# Patient Record
Sex: Female | Born: 1985 | Race: Black or African American | Hispanic: No | Smoking: Former smoker
Health system: Southern US, Community
[De-identification: ages and names within clinical notes are randomized; demographics above are authoritative.]

## PROBLEM LIST (undated history)

## (undated) DIAGNOSIS — I10 Essential (primary) hypertension: Secondary | ICD-10-CM

---

## 2007-03-06 ENCOUNTER — Emergency Department: Payer: Self-pay | Admitting: Emergency Medicine

## 2007-08-04 IMAGING — CR DG CHEST 2V
1 series · 2 of 2 positions shown · non-contrast
Comparison: none

REASON FOR EXAM: cough
COMMENTS:

PROCEDURE:     DXR - DXR CHEST PA (OR AP) AND LATERAL  - March 06, 2007  [DATE]
RESULT:     The lungs are clear. The heart and pulmonary vessels are normal.
The bony and mediastinal structures are unremarkable. There is no effusion.
There is no pneumothorax or evidence of congestive failure.

[Series 1: view not recorded · 0.17mm/px · 2 of 2 slices shown]
[im 1/2]
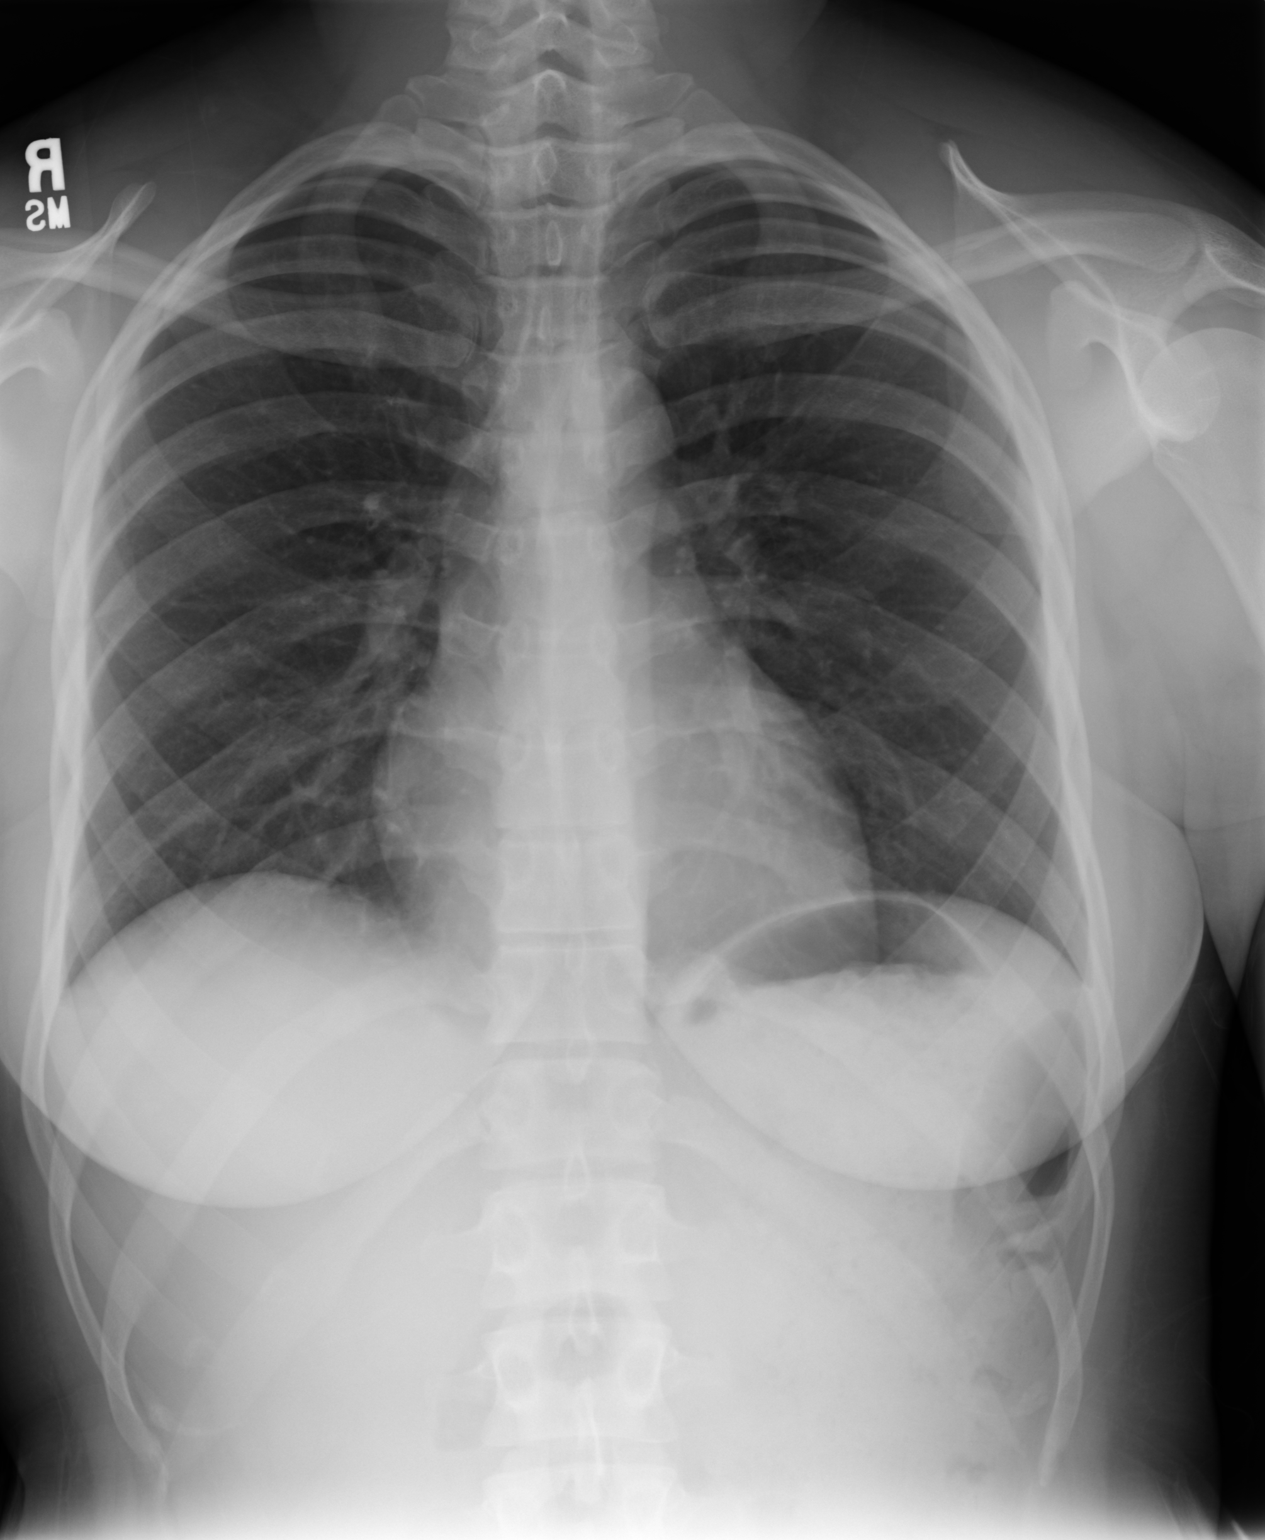
[im 2/2]
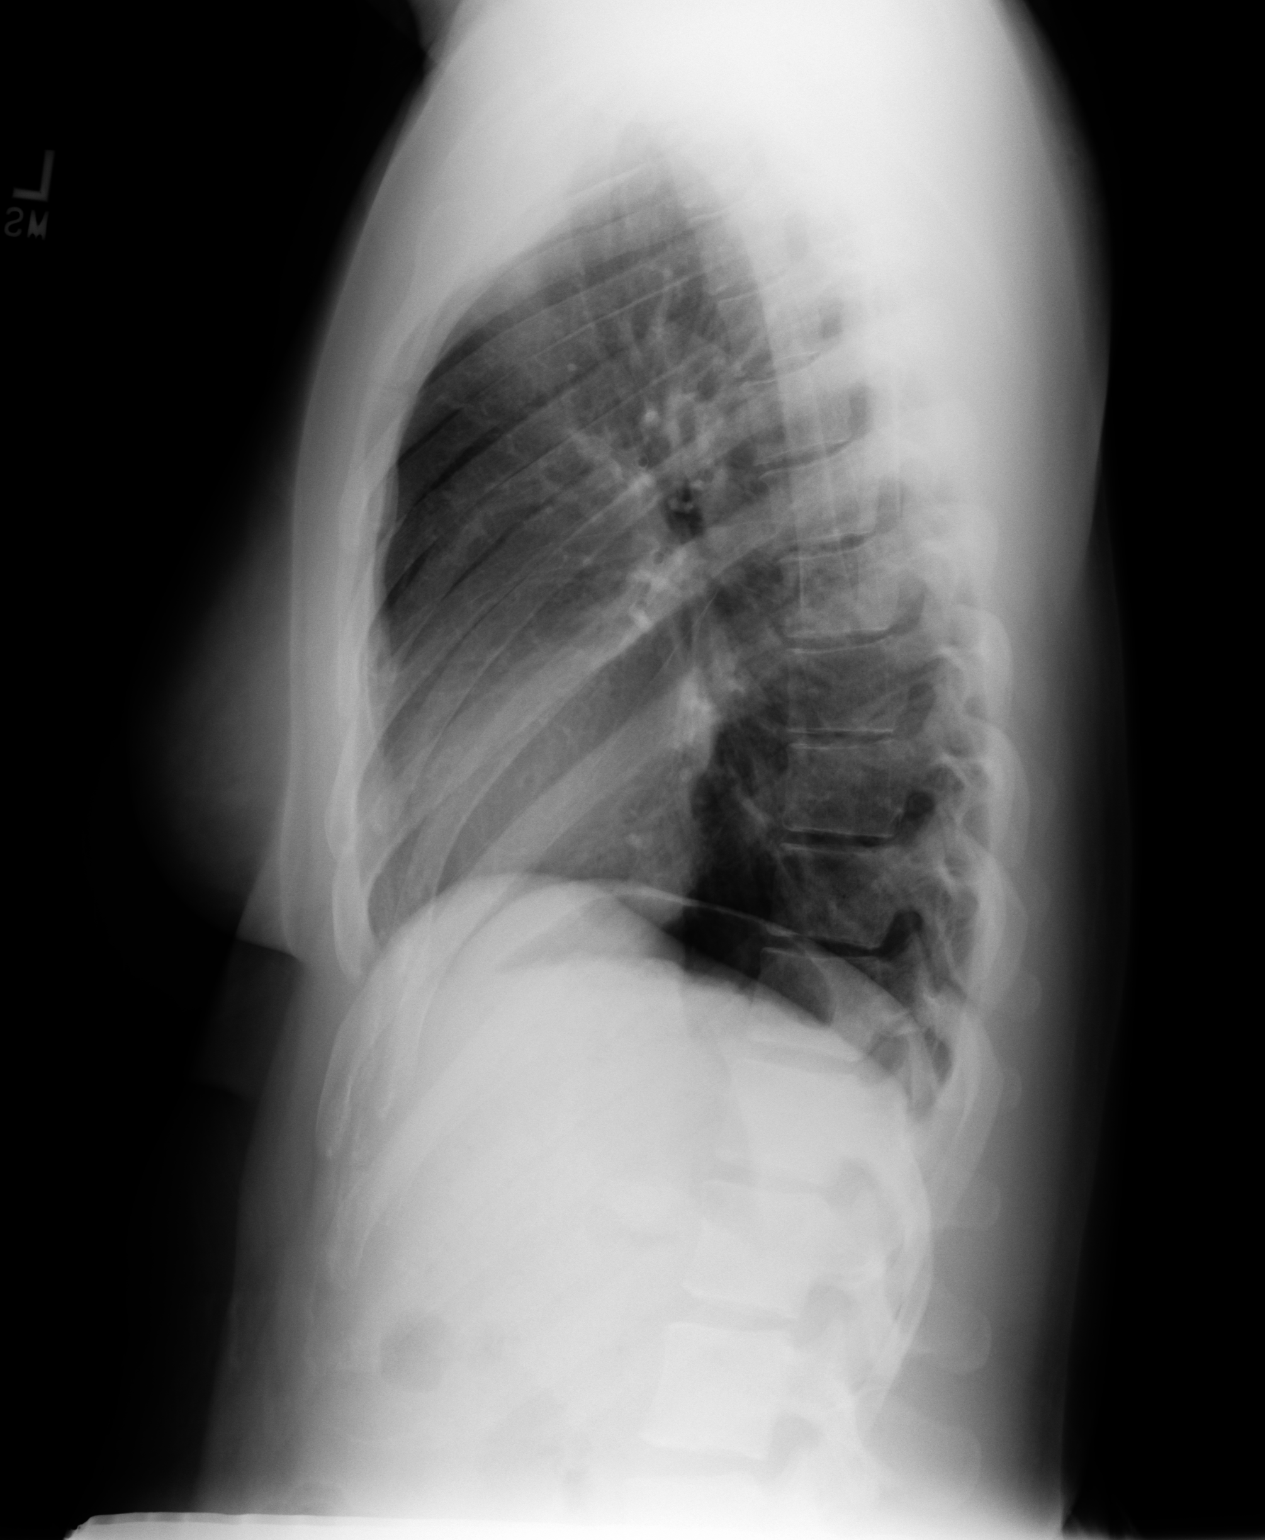

[2 of 2 positions shown; findings below may reference images not displayed]

IMPRESSION: No acute cardiopulmonary disease.

## 2014-11-04 ENCOUNTER — Ambulatory Visit (INDEPENDENT_AMBULATORY_CARE_PROVIDER_SITE_OTHER): Payer: BC Managed Care – PPO

## 2014-11-04 VITALS — BP 111/64 | HR 85 | Resp 18

## 2014-11-04 DIAGNOSIS — L600 Ingrowing nail: Secondary | ICD-10-CM

## 2014-11-04 DIAGNOSIS — L03031 Cellulitis of right toe: Secondary | ICD-10-CM

## 2014-11-04 DIAGNOSIS — M79674 Pain in right toe(s): Secondary | ICD-10-CM

## 2014-11-04 MED ORDER — CEPHALEXIN 500 MG PO CAPS: 500.0000 mg | ORAL_CAPSULE | Freq: Three times a day (TID) | ORAL | Status: DC

## 2014-11-04 NOTE — Progress Notes (Signed)
   Subjective:    Patient ID: Kylie Ross, female    DOB: 11/16/1986, 28 y.o.   MRN: [id number]  HPI I HAVE AN INGROWN TOENAIL ON MY LEFT BIG TOE AND IT HAS BEEN GOING ON SINCE THANKSGIVING AND I WENT TO GET A PEDICURE AND THERE IS NO DRAINING AND IS SORE AND RED AND HURTS WITH CERTAIN SHOES AND THE RIGHT MAY BE AND HURTS WITH PRESSURE     Review of Systems  All other systems reviewed and are negative.      Objective:   Physical Exam 28 year old [place] female well-developed well-nourished oriented 3 presents this time with painful ingrowing left great toe lateral border. She's had ingrowth medial border as well and has had issues with her contralateral right nail although the right foot at this time shows no sign of infection has a incurvated or deep sub-hallux nails bilateral. Lower extremity objective findings reveal vascular status to be intact with pedal pulses palpable DP and PT +2 over 4 bilateral Refill time 3 seconds all digits epicritic appropriate septa sensations intact and symmetric bilateral. There is normal plantar response and DTRs noted. Dermatologically skin color pigment normal hair growth absent nails criptotic particular hallux nails bilateral with ingrowing lateral nail border with paronychia of the left lateral nail. Mild granulation tissue is noted no purulence no malodor no ascending cellulitis localized erythema the nail folds noted. Remainder the exam unremarkable rectus foot type mild digital contractures noted no open wounds or ulcers identified       Assessment & Plan:  Assessment this time is ingrowing nail with paronychia lateral border left great toe. Plan at this time recommendation is for nail excision with phenol matricectomy recommended excising both borders of the left great toe patient understands this risk L Turner's reviewed and all questions asked by the patient are answered this time local anesthetic block administered total of 3 mL 50-50  mixture of 2% Xylocaine plain and 0.5% Marcaine plain to the left great toe. Betadine prep was performed the nail borders medial lateral are excised phenol matricectomy 3 applications with alcohol wash Silvadene cream and gauze dressing being applied to the left great toe. Patient given instructions for daily Betadine warm water soak recommend Tylenol as needed for pain prescription for cephalexin 500 mg 3 times a day 10 days is given. Patient will follow-up in 2 weeks for follow-up and nail check may be candidate for contralateral nail procedure at some point in the future at her convenience. Again Tylenol for pain maintain accommodative shoes contact us with any changes or exacerbations occur next  Alvan Dameichard Jamon Hayhurst DPM

## 2014-11-04 NOTE — Patient Instructions (Signed)
Betadine Soak Instructions  Purchase an 8 oz. bottle of BETADINE solution (Povidone)  THE DAY AFTER THE PROCEDURE  Place 1 tablespoon of betadine solution in a quart of warm tap water.  Submerge your foot or feet with outer bandage intact for the initial soak; this will allow the bandage to become moist and wet for easy lift off.  Once you remove your bandage, continue to soak in the solution for 20 minutes.  This soak should be done twice a day.  Next, remove your foot or feet from solution, blot dry the affected area and cover.  You may use a band aid large enough to cover the area or use gauze and tape.  Apply other medications to the area as directed by the doctor such as cortisporin otic solution (ear drops) or neosporin.  IF YOUR SKIN BECOMES IRRITATED WHILE USING THESE INSTRUCTIONS, IT IS OKAY TO SWITCH TO EPSOM SALTS AND WATER OR WHITE VINEGAR AND WATER.  Recommended Tylenol as needed for postop pain, or ibuprofen as alternative

## 2014-11-18 ENCOUNTER — Ambulatory Visit (INDEPENDENT_AMBULATORY_CARE_PROVIDER_SITE_OTHER): Payer: BC Managed Care – PPO

## 2014-11-18 VITALS — BP 106/66 | HR 97 | Resp 12

## 2014-11-18 DIAGNOSIS — L03031 Cellulitis of right toe: Secondary | ICD-10-CM

## 2014-11-18 DIAGNOSIS — Z09 Encounter for follow-up examination after completed treatment for conditions other than malignant neoplasm: Secondary | ICD-10-CM

## 2014-11-18 NOTE — Patient Instructions (Signed)

## 2014-11-18 NOTE — Progress Notes (Signed)
   Subjective:    Patient ID: Kylie LasterMeggan E Ross, female    DOB: 08/27/1986, 28 y.o.   MRN: [id number]  HPI  ''LT FOOT GREAT TOENAIL IS DOING MUCH BETTER.''  Review of Systems no new findings or systemic changes noted     Objective:   Physical Exam Patient is two-week status post AP nail procedure lateral left lateral medial border left hallux no no pain no discomfort no discharge or drainage and eschar still present still applying Neosporin and Band-Aid during the day       Assessment & Plan:  Assessment good postop progress on left great toe the nail borders are healing well maybe address the right hallux is some point in the future if it becomes painful symptomatically great nail excision may be carried out at this time however no current distress on the right foot left hallux is healed well discharge to an as-needed basis for future follow-up  Alvan Dameichard Favor Kreh DPM

## 2015-09-01 DIAGNOSIS — R1030 Lower abdominal pain, unspecified: Secondary | ICD-10-CM | POA: Diagnosis present

## 2015-09-01 DIAGNOSIS — M549 Dorsalgia, unspecified: Secondary | ICD-10-CM | POA: Insufficient documentation

## 2015-09-01 LAB — COMPREHENSIVE METABOLIC PANEL
ALBUMIN: 4 g/dL (ref 3.5–5.0)
ALK PHOS: 54 U/L (ref 38–126)
ALT: 17 U/L (ref 14–54)
AST: 21 U/L (ref 15–41)
Anion gap: 5 (ref 5–15)
BUN: 7 mg/dL (ref 6–20)
CALCIUM: 8.8 mg/dL — AB (ref 8.9–10.3)
CHLORIDE: 108 mmol/L (ref 101–111)
CO2: 23 mmol/L (ref 22–32)
CREATININE: 0.88 mg/dL (ref 0.44–1.00)
GFR calc non Af Amer: >60 mL/min (ref >60)
GLUCOSE: 107 mg/dL — AB (ref 65–99)
Potassium: 4 mmol/L (ref 3.5–5.1)
SODIUM: 136 mmol/L (ref 135–145)
Total Bilirubin: 0.4 mg/dL (ref 0.3–1.2)
Total Protein: 7.9 g/dL (ref 6.5–8.1)

## 2015-09-01 LAB — LIPASE, BLOOD: LIPASE: 42 U/L (ref 22–51)

## 2015-09-01 LAB — URINALYSIS COMPLETE WITH MICROSCOPIC (ARMC ONLY)
BILIRUBIN URINE: NEGATIVE
Bacteria, UA: NONE SEEN
Glucose, UA: NEGATIVE mg/dL
Hgb urine dipstick: NEGATIVE
Ketones, ur: NEGATIVE mg/dL
Leukocytes, UA: NEGATIVE
Nitrite: NEGATIVE
PH: 6 (ref 5.0–8.0)
Protein, ur: NEGATIVE mg/dL
Specific Gravity, Urine: 1.008 (ref 1.005–1.030)

## 2015-09-01 LAB — CBC
HCT: 40.4 % (ref 35.0–47.0)
Hemoglobin: 13.6 g/dL (ref 12.0–16.0)
MCH: 28.8 pg (ref 26.0–34.0)
MCHC: 33.5 g/dL (ref 32.0–36.0)
MCV: 85.8 fL (ref 80.0–100.0)
PLATELETS: 320 10*3/uL (ref 150–440)
RBC: 4.72 MIL/uL (ref 3.80–5.20)
RDW: 13.6 % (ref 11.5–14.5)
WBC: 10.4 10*3/uL (ref 3.6–11.0)

## 2015-09-01 LAB — POCT PREGNANCY, URINE: Preg Test, Ur: NEGATIVE

## 2015-09-01 NOTE — ED Notes (Signed)
Patient has changed her mind and consented to have blood work drawn.

## 2015-09-01 NOTE — ED Notes (Addendum)
Patient reports back pain and lower abdominal (pelvic) pain.  Reports difficulty to take deep breath.  Pt with no acute respiratory distress noted.  Patient reports seen at [facility] on Monday for same symptoms and had blood work drawn.  Patient declines to have blood work at this time.

## 2015-09-02 ENCOUNTER — Emergency Department
Admission: EM | Admit: 2015-09-02 | Discharge: 2015-09-02 | Discharge status: Against Medical Advice | Payer: BLUE CROSS/BLUE SHIELD | Attending: Emergency Medicine | Admitting: Emergency Medicine

## 2018-03-29 ENCOUNTER — Emergency Department
Admission: EM | Admit: 2018-03-29 | Discharge: 2018-03-29 | Disposition: A | Discharge status: Home / Self Care | Payer: BLUE CROSS/BLUE SHIELD | Attending: Emergency Medicine | Admitting: Emergency Medicine

## 2018-03-29 ENCOUNTER — Other Ambulatory Visit: Payer: Self-pay

## 2018-03-29 DIAGNOSIS — R1110 Vomiting, unspecified: Secondary | ICD-10-CM | POA: Diagnosis present

## 2018-03-29 DIAGNOSIS — Z79899 Other long term (current) drug therapy: Secondary | ICD-10-CM | POA: Insufficient documentation

## 2018-03-29 DIAGNOSIS — G43009 Migraine without aura, not intractable, without status migrainosus: Secondary | ICD-10-CM | POA: Diagnosis not present

## 2018-03-29 DIAGNOSIS — Z87891 Personal history of nicotine dependence: Secondary | ICD-10-CM | POA: Diagnosis not present

## 2018-03-29 LAB — URINALYSIS, COMPLETE (UACMP) WITH MICROSCOPIC
BILIRUBIN URINE: NEGATIVE
GLUCOSE, UA: NEGATIVE mg/dL
HGB URINE DIPSTICK: NEGATIVE
KETONES UR: NEGATIVE mg/dL
Leukocytes, UA: NEGATIVE
NITRITE: NEGATIVE
PH: 6 (ref 5.0–8.0)
Protein, ur: 30 mg/dL — AB
Specific Gravity, Urine: 1.027 (ref 1.005–1.030)

## 2018-03-29 LAB — COMPREHENSIVE METABOLIC PANEL
ALT: 16 U/L (ref 14–54)
AST: 22 U/L (ref 15–41)
Albumin: 3.9 g/dL (ref 3.5–5.0)
Alkaline Phosphatase: 59 U/L (ref 38–126)
Anion gap: 6 (ref 5–15)
BUN: 6 mg/dL (ref 6–20)
CALCIUM: 8.7 mg/dL — AB (ref 8.9–10.3)
CHLORIDE: 105 mmol/L (ref 101–111)
CO2: 24 mmol/L (ref 22–32)
CREATININE: 0.86 mg/dL (ref 0.44–1.00)
GFR calc Af Amer: >60 mL/min (ref >60)
GFR calc non Af Amer: >60 mL/min (ref >60)
Glucose, Bld: 109 mg/dL — ABNORMAL HIGH (ref 65–99)
Potassium: 3.8 mmol/L (ref 3.5–5.1)
Sodium: 135 mmol/L (ref 135–145)
Total Bilirubin: 0.5 mg/dL (ref 0.3–1.2)
Total Protein: 7.8 g/dL (ref 6.5–8.1)

## 2018-03-29 LAB — CBC
HCT: 38.1 % (ref 35.0–47.0)
Hemoglobin: 12.8 g/dL (ref 12.0–16.0)
MCH: 28.2 pg (ref 26.0–34.0)
MCHC: 33.6 g/dL (ref 32.0–36.0)
MCV: 84 fL (ref 80.0–100.0)
PLATELETS: 300 10*3/uL (ref 150–440)
RBC: 4.53 MIL/uL (ref 3.80–5.20)
RDW: 14.6 % — ABNORMAL HIGH (ref 11.5–14.5)
WBC: 8.7 10*3/uL (ref 3.6–11.0)

## 2018-03-29 LAB — POCT PREGNANCY, URINE: Preg Test, Ur: NEGATIVE

## 2018-03-29 LAB — LIPASE, BLOOD: LIPASE: 31 U/L (ref 11–51)

## 2018-03-29 MED ORDER — ONDANSETRON 4 MG PO TBDP
4.0000 mg | ORAL_TABLET | Freq: Once | ORAL | Status: AC | PRN
  Administered 2018-03-29: 4 mg via ORAL
  Filled 2018-03-29: qty 1

## 2018-03-29 MED ORDER — SODIUM CHLORIDE 0.9 % IV BOLUS
1000.0000 mL | Freq: Once | INTRAVENOUS | Status: AC
  Administered 2018-03-29: 1000 mL via INTRAVENOUS

## 2018-03-29 MED ORDER — DIPHENHYDRAMINE HCL 50 MG/ML IJ SOLN
50.0000 mg | Freq: Once | INTRAMUSCULAR | Status: AC
  Administered 2018-03-29: 50 mg via INTRAVENOUS
  Filled 2018-03-29: qty 1

## 2018-03-29 MED ORDER — KETOROLAC TROMETHAMINE 30 MG/ML IJ SOLN
30.0000 mg | Freq: Once | INTRAMUSCULAR | Status: AC
  Administered 2018-03-29: 30 mg via INTRAVENOUS
  Filled 2018-03-29: qty 1

## 2018-03-29 MED ORDER — METOCLOPRAMIDE HCL 5 MG/ML IJ SOLN
10.0000 mg | Freq: Once | INTRAMUSCULAR | Status: AC
  Administered 2018-03-29: 10 mg via INTRAVENOUS
  Filled 2018-03-29: qty 2

## 2018-03-29 NOTE — ED Notes (Signed)
Pt c/o headache since yesterday, c/o photophobia and hx of HTN, but not currently on any medications.

## 2018-03-29 NOTE — ED Notes (Signed)
Pt doesn't need to urinate at this time, given specimen cup with label on it.

## 2018-03-29 NOTE — ED Provider Notes (Signed)
[facility] Emergency Department Provider Note  Time seen: 5:35 PM  I have reviewed the triage vital signs and the nursing notes.   HISTORY  Chief Complaint Emesis and Headache    HPI Kylie Ross is a 32 y.o. female with no past medical history who presents to the emergency department for headache.  According to the patient yesterday she developed a very mild left-sided headache which has progressively worsened throughout the day and into today.  Patient now states it is a 9/10 headache mostly behind the left eye with pain looking at light or hearing loud noises.  States also nausea with several episodes of vomiting this morning.  Denies any history of migraines previously.  Took Tylenol at home this morning with minimal relief of the headaches if she came to the emergency department for evaluation.  Patient states her head was hurting, took her blood pressure and it was significantly elevated so she decided to come to the emergency department.  Has a history of hypertension which her doctors currently monitoring but has not yet prescribed a blood pressure medication per patient.   History reviewed. No pertinent past medical history.  There are no active problems to display for this patient.   History reviewed. No pertinent surgical history.  Prior to Admission medications   Medication Sig Start Date End Date Taking? Authorizing Provider  cephALEXin (KEFLEX) 500 MG capsule Take 1 capsule (500 mg total) by mouth 3 (three) times daily. 11/04/14   Alvan Dame, DPM  LOMEDIA 24 FE 1-20 MG-MCG(24) tablet Take 1 tablet by mouth daily. 09/13/14   [provider]  Vitamin D, Ergocalciferol, (DRISDOL) 50000 UNITS CAPS capsule  09/19/14   [provider]    No Known Allergies  History reviewed. No pertinent family history.  Social History Social History   Tobacco Use  . Smoking status: Former Games developer  . Smokeless tobacco: Never Used   Substance Use Topics  . Alcohol use: No    Alcohol/week: 0.0 oz  . Drug use: No    Review of Systems Constitutional: Negative for fever. Eyes: Pain when looking at light. ENT: Negative for recent illness/congestion Cardiovascular: Negative for chest pain. Respiratory: Negative for shortness of breath. Gastrointestinal: Negative for abdominal pain.  Nausea with vomiting this morning.  States decreased appetite with decreased oral intake over the past 3 days for unknown reasons. Genitourinary: Negative for urinary compaints Musculoskeletal: Negative for musculoskeletal complaints Skin: Negative for skin complaints  Neurological: 9/10 left-sided headache All other ROS negative  ____________________________________________   PHYSICAL EXAM:  VITAL SIGNS: ED Triage Vitals  Enc Vitals Group     BP 03/29/18 1558 (!) 171/113     Pulse Rate 03/29/18 1558 80     Resp 03/29/18 1558 18     Temp 03/29/18 1558 98.6 F (37 C)     Temp Source 03/29/18 1558 Oral     SpO2 03/29/18 1558 99 %     Weight 03/29/18 1559 254 lb (115.2 kg)     Height 03/29/18 1559  (1.803 m)     Head Circumference --      Peak Flow --      Pain Score 03/29/18 1559 10     Pain Loc --      Pain Edu? --      Excl. in GC? --     Constitutional: Alert and oriented. Well appearing and in no distress. Eyes: Mild photophobia on examination.  PERRL and extraocular muscles intact.  ENT   Head: Normocephalic and atraumatic.   Mouth/Throat: Mucous membranes are moist. Cardiovascular: Normal rate, regular rhythm. No murmur Respiratory: Normal respiratory effort without tachypnea nor retractions. Breath sounds are clear Gastrointestinal: Soft and nontender. No distention.  Musculoskeletal: Nontender with normal range of motion in all extremities. Neurologic:  Normal speech and language. No gross focal neurologic deficits.  Equal grip strength.  No pronator drift.  Cranial nerves intact.   Skin:  Skin is  warm, dry and intact.  Psychiatric: Mood and affect are normal.  ____________________________________________   INITIAL IMPRESSION / ASSESSMENT AND PLAN / ED COURSE  Pertinent labs & imaging results that were available during my care of the patient were reviewed by me and considered in my medical decision making (see chart for details).  Patient presents to the emergency department for left-sided headache progressively worsening over the past 24 hours.  Differential would include migraine headache, tension headache, hypertension.  Patient's labs are normal, no fever.  Exam is reassuring with a normal neurological examination.  No distress.  We will treat with migraine cocktail medications, IV fluids and continue to closely monitor.  Patient states that headache is gone.  We will discharge home, discussed return precautions.  ____________________________________________   FINAL CLINICAL IMPRESSION(S) / ED DIAGNOSES  Headache    Minna Antis, MD 03/29/18 1920

## 2018-03-29 NOTE — ED Triage Notes (Signed)
Pt alert, oriented, ambulatory. C/o nausea and headache yesterday. Only vomited once (in triage room). No hx headaches. Headache at L temporal and top of head. Sensitivity to light and movement.   Pt states PCP is following her BP but hasn't started her on any medications for HTN yet.

## 2018-06-09 ENCOUNTER — Other Ambulatory Visit: Payer: Self-pay

## 2018-06-09 ENCOUNTER — Encounter (HOSPITAL_COMMUNITY): Payer: Self-pay | Admitting: *Deleted

## 2018-06-09 ENCOUNTER — Inpatient Hospital Stay (HOSPITAL_COMMUNITY)
Admission: RE | Admit: 2018-06-09 | Discharge: 2018-06-13 | DRG: 885 | Disposition: A | Discharge status: Home / Self Care | Payer: 59 | Attending: Psychiatry | Admitting: Psychiatry

## 2018-06-09 DIAGNOSIS — F322 Major depressive disorder, single episode, severe without psychotic features: Secondary | ICD-10-CM | POA: Diagnosis present

## 2018-06-09 DIAGNOSIS — F410 Panic disorder [episodic paroxysmal anxiety] without agoraphobia: Secondary | ICD-10-CM | POA: Diagnosis present

## 2018-06-09 DIAGNOSIS — F4310 Post-traumatic stress disorder, unspecified: Secondary | ICD-10-CM | POA: Diagnosis present

## 2018-06-09 DIAGNOSIS — I10 Essential (primary) hypertension: Secondary | ICD-10-CM | POA: Diagnosis present

## 2018-06-09 DIAGNOSIS — Z79899 Other long term (current) drug therapy: Secondary | ICD-10-CM | POA: Diagnosis not present

## 2018-06-09 DIAGNOSIS — Z91410 Personal history of adult physical and sexual abuse: Secondary | ICD-10-CM | POA: Diagnosis not present

## 2018-06-09 DIAGNOSIS — G4700 Insomnia, unspecified: Secondary | ICD-10-CM | POA: Diagnosis present

## 2018-06-09 DIAGNOSIS — Z87891 Personal history of nicotine dependence: Secondary | ICD-10-CM

## 2018-06-09 DIAGNOSIS — F419 Anxiety disorder, unspecified: Secondary | ICD-10-CM | POA: Diagnosis not present

## 2018-06-09 DIAGNOSIS — R45851 Suicidal ideations: Secondary | ICD-10-CM | POA: Diagnosis present

## 2018-06-09 HISTORY — DX: Essential (primary) hypertension: I10

## 2018-06-09 MED ORDER — TRAZODONE HCL 50 MG PO TABS
50.0000 mg | ORAL_TABLET | Freq: Every day | ORAL | Status: DC
  Administered 2018-06-09 – 2018-06-10 (×2): 50 mg via ORAL
  Filled 2018-06-09 (×5): qty 1

## 2018-06-09 MED ORDER — MAGNESIUM HYDROXIDE 400 MG/5ML PO SUSP: 30.0000 mL | Freq: Every day | ORAL | Status: DC | PRN

## 2018-06-09 MED ORDER — ALUM & MAG HYDROXIDE-SIMETH 200-200-20 MG/5ML PO SUSP
30.0000 mL | ORAL | Status: DC | PRN
  Administered 2018-06-10: 30 mL via ORAL
  Filled 2018-06-09: qty 30

## 2018-06-09 MED ORDER — ACETAMINOPHEN 500 MG PO TABS
1000.0000 mg | ORAL_TABLET | Freq: Four times a day (QID) | ORAL | Status: DC | PRN
  Administered 2018-06-09 – 2018-06-13 (×5): 1000 mg via ORAL
  Filled 2018-06-09 (×5): qty 2

## 2018-06-09 NOTE — H&P (Signed)
Behavioral Health Medical Screening Exam  Kylie Ross is an 32 y.o. female patient presents as walk in with complaints of worsening depression and suicidal ideation "I was going to run my car into the corner of the church so someone would be able to find me; or my friend gave me a BB gun that looks real; I was going to go into a store or something and act like I was robbing so that someone else would shoot me."  Patient unable to contract for safety  Total Time spent with patient: 30 minutes  Psychiatric Specialty Exam: Physical Exam  Vitals reviewed. Constitutional: She is oriented to person, place, and time. She appears well-developed and well-nourished.  Neck: Normal range of motion.  Respiratory: Effort normal.  Musculoskeletal: Normal range of motion.  Neurological: She is alert and oriented to person, place, and time.  Skin: Skin is warm and dry.  Psychiatric: Her mood appears anxious. She is withdrawn. Cognition and memory are normal. She expresses impulsivity. She exhibits a depressed mood. She expresses suicidal ideation. She expresses suicidal plans.    Review of Systems  Psychiatric/Behavioral: Positive for depression and memory loss. Negative for suicidal ideas.  All other systems reviewed and are negative.   Blood pressure (!) 138/96, pulse 96, temperature 99.2 F (37.3 C), resp. rate 20, SpO2 99 %.There is no height or weight on file to calculate BMI.  General Appearance: Casual  Eye Contact:  Good  Speech:  Normal Rate  Volume:  Normal  Mood:  Depressed and Hopeless  Affect:  Depressed, Flat and Tearful  Thought Process:  Coherent  Orientation:  Full (Time, Place, and Person)  Thought Content:  Patient would not respond when asked about hallucinations  Suicidal Thoughts:  Yes.  with intent/plan  Homicidal Thoughts:  No  Memory:  Immediate;   Fair Recent;   Fair Remote;   Fair  Judgement:  Impaired  Insight:  Lacking  Psychomotor Activity:  Decreased   Concentration: Concentration: Fair and Attention Span: Fair  Recall:  [employer]Fair  Fund of Knowledge:Fair  Language: Good  Akathisia:  No  Handed:  Right  AIMS (if indicated):     Assets:  Communication Skills Desire for Improvement Housing Social Support  Sleep:       Musculoskeletal: Strength & Muscle Tone: within normal limits Gait & Station: normal Patient leans: N/A  Blood pressure (!) 138/96, pulse 96, temperature 99.2 F (37.3 C), resp. rate 20, SpO2 99 %.  Recommendations:   Inpatient psychiatric treatment  Based on my evaluation the patient does not appear to have an emergency medical condition.  Kylie Hollan, NP 06/09/2018, 5:30 PM
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
## 2018-06-09 NOTE — Progress Notes (Signed)
D: Patient observed isolative to room this evening. Flat, sad, tearful, hopeless and depressed. Complained of a headache of a 10/10 at start of this writer's shift. Patient also states "I haven't had my meds today" however patient is unclear about her medications stating, "well, I was on certain medications but then I started taking my old paxil. You could call my pharmacy. I can't remember the name. B12 I think and my birth control. [place] [facility] [place]    A: Medicated per orders, prn tylenol given for discomfort. Medication education provided. Level III obs in place for safety. Emotional support offered. Patient encouraged to complete Suicide Safety Plan before discharge. Encouraged to attend and participate in unit programming. EKG performed. Reviewed EKG with Erlene QuanJ Berry, NP. Encouraged patient to contact aunt who may be able to provide a list of home medications.  Reminded of need for urine sample (patient has cup). Pointed out to patient incongruence of signing 72 hour RFD while also endorsing passive SI. Encouraged patient to consider rescinding prior to Friday.  R: Patient verbalizes understanding of POC, topics discussed. States she will attempt to obtain medication information. On reassess of tylenol admin, patient's headache was a 5/10. Patient denies HI/AVH however endorses passive SI. Verbal contract in place for safety and remains safe on level III obs.  Will continue to monitor throughout the night.
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
## 2018-06-09 NOTE — Progress Notes (Signed)
Patient signed 72 hour RFD which was placed on paper chart. 

## 2018-06-10 DIAGNOSIS — F322 Major depressive disorder, single episode, severe without psychotic features: Principal | ICD-10-CM

## 2018-06-10 DIAGNOSIS — F419 Anxiety disorder, unspecified: Secondary | ICD-10-CM

## 2018-06-10 DIAGNOSIS — F410 Panic disorder [episodic paroxysmal anxiety] without agoraphobia: Secondary | ICD-10-CM

## 2018-06-10 DIAGNOSIS — Z91410 Personal history of adult physical and sexual abuse: Secondary | ICD-10-CM

## 2018-06-10 LAB — URINALYSIS, COMPLETE (UACMP) WITH MICROSCOPIC
BACTERIA UA: NONE SEEN
BILIRUBIN URINE: NEGATIVE
GLUCOSE, UA: NEGATIVE mg/dL
Hgb urine dipstick: NEGATIVE
KETONES UR: NEGATIVE mg/dL
Leukocytes, UA: NEGATIVE
NITRITE: NEGATIVE
PH: 6 (ref 5.0–8.0)
PROTEIN: NEGATIVE mg/dL
Specific Gravity, Urine: 1.014 (ref 1.005–1.030)

## 2018-06-10 LAB — RAPID URINE DRUG SCREEN, HOSP PERFORMED
Amphetamines: NOT DETECTED
Benzodiazepines: NOT DETECTED
Cocaine: NOT DETECTED
Opiates: NOT DETECTED
TETRAHYDROCANNABINOL: POSITIVE — AB

## 2018-06-10 LAB — PREGNANCY, URINE: Preg Test, Ur: NEGATIVE

## 2018-06-10 MED ORDER — LISINOPRIL 20 MG PO TABS
20.0000 mg | ORAL_TABLET | Freq: Every day | ORAL | Status: DC
  Administered 2018-06-10 – 2018-06-13 (×4): 20 mg via ORAL
  Filled 2018-06-10 (×7): qty 1

## 2018-06-10 MED ORDER — SERTRALINE HCL 50 MG PO TABS
50.0000 mg | ORAL_TABLET | Freq: Every day | ORAL | Status: DC
  Administered 2018-06-10 – 2018-06-13 (×4): 50 mg via ORAL
  Filled 2018-06-10 (×6): qty 1

## 2018-06-10 MED ORDER — LORAZEPAM 0.5 MG PO TABS
0.5000 mg | ORAL_TABLET | Freq: Four times a day (QID) | ORAL | Status: DC | PRN
  Administered 2018-06-10 – 2018-06-11 (×2): 0.5 mg via ORAL
  Filled 2018-06-10 (×2): qty 1

## 2018-06-10 MED ORDER — ADULT MULTIVITAMIN W/MINERALS CH
1.0000 | ORAL_TABLET | Freq: Every day | ORAL | Status: DC
  Administered 2018-06-10 – 2018-06-13 (×4): 1 via ORAL
  Filled 2018-06-10 (×6): qty 1

## 2018-06-10 NOTE — Tx Team (Addendum)
Interdisciplinary Treatment and Diagnostic Plan Update  06/11/2018 Time of Session: [place] MRN: [id number]  Principal Diagnosis: <principal problem not specified>  Secondary Diagnoses: Active Problems:   MDD (major depressive disorder), severe (HCC)   Current Medications:  Current Facility-Administered Medications  Medication Dose Route Frequency Provider Last Rate Last Dose  . acetaminophen (TYLENOL) tablet 1,000 mg  1,000 mg Oral Q6H PRN Cobos, Myer Peer, MD   1,000 mg at 06/09/18 2034  . alum & mag hydroxide-simeth (MAALOX/MYLANTA) 200-200-20 MG/5ML suspension 30 mL  30 mL Oral Q4H PRN Cobos, Myer Peer, MD   30 mL at 06/10/18 1849  . lisinopril (PRINIVIL,ZESTRIL) tablet 20 mg  20 mg Oral Daily Cobos, Myer Peer, MD   20 mg at 06/10/18 1702  . LORazepam (ATIVAN) tablet 0.5 mg  0.5 mg Oral Q6H PRN Cobos, Myer Peer, MD   0.5 mg at 06/10/18 2117  . magnesium hydroxide (MILK OF MAGNESIA) suspension 30 mL  30 mL Oral Daily PRN Cobos, Myer Peer, MD      . multivitamin with minerals tablet 1 tablet  1 tablet Oral Daily Cobos, Myer Peer, MD   1 tablet at 06/10/18 1849  . sertraline (ZOLOFT) tablet 50 mg  50 mg Oral Daily Cobos, Myer Peer, MD   50 mg at 06/10/18 1702  . traZODone (DESYREL) tablet 50 mg  50 mg Oral QHS Cobos, Myer Peer, MD   50 mg at 06/10/18 2115   PTA Medications: Medications Prior to Admission  Medication Sig Dispense Refill Last Dose  . calcium-vitamin D (OSCAL WITH D) 500-200 MG-UNIT tablet Take 1 tablet by mouth 2 (two) times daily.   Past Week at Unknown time  . lisinopril (PRINIVIL,ZESTRIL) 20 MG tablet Take 20 mg by mouth daily.   Past Week at Unknown time  . LOMEDIA 24 FE 1-20 MG-MCG(24) tablet Take 1 tablet by mouth daily.  2 06/08/2018 at Unknown time  . Probiotic Product (ALIGN PO) Take 1 tablet by mouth daily.   Past Week at Unknown time  . sertraline (ZOLOFT) 50 MG tablet Take 50 mg by mouth daily.   Past Week at Unknown time  . traZODone  (DESYREL) 50 MG tablet Take 50-100 mg by mouth at bedtime as needed for sleep.   Past Week at Unknown time  . vitamin B-12 (CYANOCOBALAMIN) 1000 MCG tablet Take 1,000 mcg by mouth daily.   Past Week at Unknown time  . Vitamin D, Ergocalciferol, (DRISDOL) 50000 UNITS CAPS capsule 50,000 Units every 7 (seven) days.   1 Past Week at Unknown time    Patient Stressors: Financial difficulties Medication change or noncompliance Occupational concerns  Patient Strengths: Average or above average intelligence Communication skills General fund of knowledge Physical Health Supportive family/friends  Treatment Modalities: Medication Management, Group therapy, Case management,  1 to 1 session with clinician, Psychoeducation, Recreational therapy.   Physician Treatment Plan for Primary Diagnosis: <principal problem not specified> Long Term Goal(s): Improvement in symptoms so as ready for discharge Improvement in symptoms so as ready for discharge   Short Term Goals: Ability to identify changes in lifestyle to reduce recurrence of condition will improve Ability to maintain clinical measurements within normal limits will improve Ability to identify changes in lifestyle to reduce recurrence of condition will improve Ability to verbalize feelings will improve Ability to disclose and discuss suicidal ideas Ability to demonstrate self-control will improve Ability to identify and develop effective coping behaviors will improve  Medication Management: Evaluate patient's response, side effects, and  tolerance of medication regimen.  Therapeutic Interventions: 1 to 1 sessions, Unit Group sessions and Medication administration.  Evaluation of Outcomes: Not Met  Physician Treatment Plan for Secondary Diagnosis: Active Problems:   MDD (major depressive disorder), severe ([place])  Long Term Goal(s): Improvement in symptoms so as ready for discharge Improvement in symptoms so as ready for discharge   Short  Term Goals: Ability to identify changes in lifestyle to reduce recurrence of condition will improve Ability to maintain clinical measurements within normal limits will improve Ability to identify changes in lifestyle to reduce recurrence of condition will improve Ability to verbalize feelings will improve Ability to disclose and discuss suicidal ideas Ability to demonstrate self-control will improve Ability to identify and develop effective coping behaviors will improve     Medication Management: Evaluate patient's response, side effects, and tolerance of medication regimen.  Therapeutic Interventions: 1 to 1 sessions, Unit Group sessions and Medication administration.  Evaluation of Outcomes: Not Met   RN Treatment Plan for Primary Diagnosis: <principal problem not specified> Long Term Goal(s): Knowledge of disease and therapeutic regimen to maintain health will improve  Short Term Goals: Ability to identify and develop effective coping behaviors will improve and Compliance with prescribed medications will improve  Medication Management: RN will administer medications as ordered by provider, will assess and evaluate patient's response and provide education to patient for prescribed medication. RN will report any adverse and/or side effects to prescribing provider.  Therapeutic Interventions: 1 on 1 counseling sessions, Psychoeducation, Medication administration, Evaluate responses to treatment, Monitor vital signs and CBGs as ordered, Perform/monitor CIWA, COWS, AIMS and Fall Risk screenings as ordered, Perform wound care treatments as ordered.  Evaluation of Outcomes: Not Met   LCSW Treatment Plan for Primary Diagnosis: <principal problem not specified> Long Term Goal(s): Safe transition to appropriate next level of care at discharge, Engage patient in therapeutic group addressing interpersonal concerns.  Short Term Goals: Engage patient in aftercare planning with referrals and  resources, Increase social support and Increase skills for wellness and recovery  Therapeutic Interventions: Assess for all discharge needs, 1 to 1 time with Social worker, Explore available resources and support systems, Assess for adequacy in community support network, Educate family and significant other(s) on suicide prevention, Complete Psychosocial Assessment, Interpersonal group therapy.  Evaluation of Outcomes: Not Met   Progress in Treatment: Attending groups: No. Participating in groups: No. Taking medication as prescribed: Yes. Toleration medication: Yes. Family/Significant other contact made: No, will contact:  when given permission Patient understands diagnosis: Yes. Discussing patient identified problems/goals with staff: Yes. Medical problems stabilized or resolved: Yes. Denies suicidal/homicidal ideation: Yes. Issues/concerns per patient self-inventory: No. Other: none  New problem(s) identified: No, Describe:  none  New Short Term/Long Term Goal(s):  Patient Goals:  "To go home as soon as possible."  Discharge Plan or Barriers:   Reason for Continuation of Hospitalization: Depression Medication stabilization  Estimated Length of Stay: 2-4 days.  Attendees: Patient: Kylie Ross 06/10/2018   Physician: Dr. Parke Poisson, MD 06/10/2018   Nursing: Darrol Angel, RN 06/10/2018   RN Care Manager: 06/10/2018   Social Worker: Lurline Idol, LCSW 06/10/2018   Recreational Therapist:  06/10/2018   Other:  06/10/2018   Other:  06/10/2018   Other: 06/10/2018        Scribe for Treatment Team: Joanne Chars, [place] 06/11/2018 7:42 AM

## 2018-06-10 NOTE — [facility] Group Notes (Signed)
[facility] Group Therapy 06/10/2018 1:15pm  Type of Therapy: Mental Health Association Presentation  Participation Level: Active  Participation Quality: Attentive  Affect: Appropriate  Cognitive: Oriented  Insight: Developing/Improving  Engagement in Therapy: Engaged  Modes of Intervention: Discussion, Education and Socialization  Summary of Progress/Problems: Mental Health Association (MHA) Speaker came to talk about his personal journey with mental health. The pt processed ways by which to relate to the speaker. MHA speaker provided handouts and educational information pertaining to groups and services offered by the MHA. Pt was engaged in speaker's presentation and was receptive to resources provided.    Kylie Ross S Kylie Pleitez, LCSW 06/10/2018 2:46 PM  

## 2018-06-10 NOTE — Progress Notes (Signed)
Pt presents with a flat affect and depressed mood. Pt rated on her self inventory sheet: depression 10/10, anxiety 4/10 and hopelessness 8/10. Pt endorses passive SI with no plan or intent. Pt stated in regards to not attempting suicide, "there's nothing I can do here". "I'm suicidal because I want to go home". Pt expressed that she doesn't have any family here  In the area and wants to go back [place] to [place] family [place]. Pt appears to have little insight for tx and is adamant about discharging.  Orders reviewed. Verbal support provided. Pt elevated b/p rechecked and reported to Dr. Jama Flavorsobos. Pt asymptomatic. Verbal support provided. Pt encouraged to attend groups. 15 minute checks performed for safety. Pt attended treatment team.  Pt stated goal, "I want to go home". Pt compliant with tx plan.

## 2018-06-10 NOTE — Progress Notes (Signed)
D: Patient denies SI, HI or AVH this evening. Patient is pleasant and animated on approach.  She states that her day started off rough but states that things have improved and states that she spoke with her mother several times which she was very happy about.  Pt. States she is working on her "reaction to perceived disrespect", states she will be working on looking at the whole picture.  A: Patient given emotional support from RN. Patient encouraged to come to staff with concerns and/or questions. Patient's medication routine continued. Patient's orders and plan of care reviewed.   R: Patient remains appropriate and cooperative. Will continue to monitor patient q15 minutes for safety.

## 2018-06-10 NOTE — Therapy (Signed)
Occupational Therapy Group Note  Date:  06/10/2018 Time:  2:48 PM  Group Topic/Focus:  Stress Management  Participation Level:  Active  Participation Quality:  Appropriate and Sharing  Affect:  Flat  Cognitive:  Appropriate  Insight: Improving  Engagement in Group:  Engaged  Modes of Intervention:  Activity, Discussion, Education and Socialization  Additional Comments:   S: "I have learned that deep breathing does not work for me"  O: Stress management group completed to use as productive coping strategy, to help mitigate maladaptive coping to integrate in functional BADL/IADL when reintegrating into community. Stress management tools worksheet completed to identify negative coping mechanisms and their short and long term effects vs positive coping mechanisms with demonstration. Coping strategies taught include: relaxation based- deep breathing, counting to 10, taking a 1 minute vacation, acceptance, stress balls, relaxation audio/video, visual/mental imagery. Positive mental attitude- gratitude, acceptance, cognitive reframing, positive self talk, anger management.  Gratitude journaling handout and instruction also given. Adult coloring and relaxation tips worksheet given at end of session.   A: Pt presents to group with flat affect, extremely engaged and participatory this date. Stress management tools worksheet compelted, pt sharing how she has worked through negative coping mechanisms and how they have effected her personally. Pt reports she enjoys using apps for relaxation and wants to continue this practice along with coloring and zen tangles. Pt eager to acquire handouts at end of session.   P: Pt provided with education on stress management activities to implement into daily routine. Handouts given to facilitate carryover when reintegrating into community   University Endoscopy CenterKaylee Kiyra Ross, [place], OTR/L  AvnetKaylee Arella Ross 06/10/2018, 2:48 PM

## 2018-06-10 NOTE — Progress Notes (Signed)
Adult Psychoeducational Group Note  Date:  06/10/2018 Time:  9:04 PM  Group Topic/Focus:  Wrap-Up Group:   The focus of this group is to help patients review their daily goal of treatment and discuss progress on daily workbooks.  Participation Level:  Active  Participation Quality:  Appropriate  Affect:  Excited  Cognitive:  Alert  Insight: Appropriate  Engagement in Group:  Engaged  Modes of Intervention:  Discussion and Support  Additional Comments:  Pt was active in group. Pt did not have a goal for the day.Pt said a positive thing that happened today was that she made friends here. Pt goal for tomorrow is to identify triggers and coping skills.  Ova FreshwaterChristina Abygayle Ross 06/10/2018, 9:04 PM
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
## 2018-06-10 NOTE — Progress Notes (Signed)
Recreation Therapy Notes  Date: 7.10.19 Time: 0930 Location: 300 Hall Dayroom  Group Topic: Stress Management  Goal Area(s) Addresses:  Patient will verbalize importance of using healthy stress management.  Patient will identify positive emotions associated with healthy stress management.   Intervention: Stress Management  Activity :  Guided Imagery.  LRT introduced the stress management technique of guided imagery.  LRT read a script to allow patients to visualize being outside on a bright summer day.  Patients were to follow along as script was read.  Education:  Stress Management, Discharge Planning.   Education Outcome: Acknowledges edcuation/In group clarification offered/Needs additional education  Clinical Observations/Feedback: Pt did not attend group.    Caroll RancherMarjette Dennise Raabe, LRT/CTRS         Lillia AbedLindsay, Mckyle Solanki A 06/10/2018 12:00 PM
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
## 2018-06-10 NOTE — Plan of Care (Signed)
  Problem: Medication: Goal: Compliance with prescribed medication regimen will improve Outcome: Progressing   Problem: Self-Concept: Goal: Ability to disclose and discuss suicidal ideas will improve Outcome: Progressing   

## 2018-06-11 LAB — COMPREHENSIVE METABOLIC PANEL
ALT: 14 U/L (ref 0–44)
AST: 17 U/L (ref 15–41)
Albumin: 3.7 g/dL (ref 3.5–5.0)
Alkaline Phosphatase: 60 U/L (ref 38–126)
Anion gap: 7 (ref 5–15)
BILIRUBIN TOTAL: 0.4 mg/dL (ref 0.3–1.2)
BUN: 7 mg/dL (ref 6–20)
CHLORIDE: 107 mmol/L (ref 98–111)
CO2: 26 mmol/L (ref 22–32)
CREATININE: 0.91 mg/dL (ref 0.44–1.00)
Calcium: 8.9 mg/dL (ref 8.9–10.3)
Glucose, Bld: 125 mg/dL — ABNORMAL HIGH (ref 70–99)
POTASSIUM: 4.1 mmol/L (ref 3.5–5.1)
Sodium: 140 mmol/L (ref 135–145)
TOTAL PROTEIN: 7.4 g/dL (ref 6.5–8.1)

## 2018-06-11 LAB — CBC
HEMATOCRIT: 37.7 % (ref 36.0–46.0)
Hemoglobin: 12.6 g/dL (ref 12.0–15.0)
MCH: 27.7 pg (ref 26.0–34.0)
MCHC: 33.4 g/dL (ref 30.0–36.0)
MCV: 82.9 fL (ref 78.0–100.0)
PLATELETS: 370 10*3/uL (ref 150–400)
RBC: 4.55 MIL/uL (ref 3.87–5.11)
RDW: 14.4 % (ref 11.5–15.5)
WBC: 9 10*3/uL (ref 4.0–10.5)

## 2018-06-11 LAB — LIPID PANEL
CHOLESTEROL: 163 mg/dL (ref 0–200)
HDL: 50 mg/dL (ref >40)
LDL CALC: 80 mg/dL (ref 0–99)
TRIGLYCERIDES: 164 mg/dL — AB (ref <150)
Total CHOL/HDL Ratio: 3.3 RATIO
VLDL: 33 mg/dL (ref 0–40)

## 2018-06-11 LAB — ETHANOL: Alcohol, Ethyl (B): <10 mg/dL (ref <10)

## 2018-06-11 LAB — HEMOGLOBIN A1C
HEMOGLOBIN A1C: 6.5 % — AB (ref 4.8–5.6)
MEAN PLASMA GLUCOSE: 139.85 mg/dL

## 2018-06-11 LAB — TSH: TSH: 2.087 u[IU]/mL (ref 0.350–4.500)

## 2018-06-11 MED ORDER — TRAZODONE HCL 50 MG PO TABS
50.0000 mg | ORAL_TABLET | Freq: Every evening | ORAL | Status: DC | PRN
  Administered 2018-06-11: 50 mg via ORAL
  Filled 2018-06-11: qty 1

## 2018-06-11 MED ORDER — LORAZEPAM 2 MG/ML IJ SOLN
INTRAMUSCULAR | Status: AC
  Filled 2018-06-11: qty 1

## 2018-06-11 MED ORDER — HALOPERIDOL LACTATE 5 MG/ML IJ SOLN: 5.0000 mg | Freq: Once | INTRAMUSCULAR | Status: DC | PRN

## 2018-06-11 MED ORDER — TRAZODONE HCL 50 MG PO TABS
50.0000 mg | ORAL_TABLET | Freq: Every evening | ORAL | Status: DC | PRN
  Administered 2018-06-11: 50 mg via ORAL
  Filled 2018-06-11 (×4): qty 1

## 2018-06-11 MED ORDER — ALIGN 4 MG PO CAPS
4.0000 mg | ORAL_CAPSULE | Freq: Every day | ORAL | Status: DC
  Administered 2018-06-11 – 2018-06-13 (×3): 4 mg via ORAL
  Filled 2018-06-11 (×5): qty 1

## 2018-06-11 MED ORDER — DIPHENHYDRAMINE HCL 50 MG/ML IJ SOLN: 50.0000 mg | Freq: Once | INTRAMUSCULAR | Status: DC | PRN

## 2018-06-11 MED ORDER — LORAZEPAM 2 MG/ML IJ SOLN
2.0000 mg | Freq: Once | INTRAMUSCULAR | Status: AC
  Administered 2018-06-11: 2 mg via INTRAMUSCULAR

## 2018-06-11 NOTE — Progress Notes (Addendum)
Pt came out at the hall way yelling at staff asking to be given something to kill her self. Pt stated "I want to die, you all are not listening to me." Pt then went at the end of 400 hall and ripped locator off the door, she banged the door and hurt her right index finger and scratched her left wrist, pt also ,broke the door.  Pt given one time order of 2 mg at IM which pt took willingly. The family member informed of the incident. Pt currently observed calmly using the phone. Remains on 1:1 obs, will continue to monitor.

## 2018-06-11 NOTE — [facility] Group Notes (Signed)
[facility] LCSW Group Therapy Note  Date/Time: 06/11/18, 1315  Type of Therapy/Topic:  Group Therapy:  Balance in Life  Participation Level:  active  Description of Group:    This group will address the concept of balance and how it feels and looks when one is unbalanced. Patients will be encouraged to process areas in their lives that are out of balance, and identify reasons for remaining unbalanced. Facilitators will guide patients utilizing problem- solving interventions to address and correct the stressor making their life unbalanced. Understanding and applying boundaries will be explored and addressed for obtaining  and maintaining a balanced life. Patients will be encouraged to explore ways to assertively make their unbalanced needs known to significant others in their lives, using other group members and facilitator for support and feedback.  Therapeutic Goals: 1. Patient will identify two or more emotions or situations they have that consume much of in their lives. 2. Patient will identify signs/triggers that life has become out of balance:  3. Patient will identify two ways to set boundaries in order to achieve balance in their lives:  4. Patient will demonstrate ability to communicate their needs through discussion and/or role plays  Summary of Patient Progress: Pt shared that financial and physical are the areas that are currently out of balance in her life.  Pt active in group discussion about ways to recognized and respond to areas of life that get out of balance.          Therapeutic Modalities:   Cognitive Behavioral Therapy Solution-Focused Therapy Assertiveness Training  Daleen SquibbGreg Cyana Shook, [place]

## 2018-06-11 NOTE — Progress Notes (Signed)
Pt presents with an animated affect and anxious mood. Pt rated on her self inventory sheet: depression 2/10, anxiety 2/10, hopelessness 3/10, sleep-good and appetite-good. Pt denies SI. Pt expressed that she wanted to go back home to [place], to be with her family for support but is now reconsidering staying in town. Pt expressed that after speaking to her mother, she feels like returning home will cause more stress. Pt expressed that she plans to attend [facility] for outpt treatment and feels that it will be beneficial for her.  Orders reviewed with pt. Verbal support provided. Pt encouraged to attend groups. V/s assessed. Elevated b/p rechecked and Dr. Jama Flavorsobos made aware. 15 minute checks performed for safety.  Pt compliant with tx plan.
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
## 2018-06-11 NOTE — Progress Notes (Signed)
Nutrition Education Note  Pt attended group focusing on general, healthful nutrition education.  RD emphasized the importance of eating regular meals and snacks throughout the day. Consuming sugar-free beverages and incorporating fruits and vegetables into diet when possible. Provided examples of healthy snacks. Patient encouraged to leave group with a goal to improve nutrition/healthy eating.   Diet Order:  Diet Order           Diet regular Room service appropriate? Yes; Fluid consistency: Thin  Diet effective now         Pt is also offered choice of unit snacks mid-morning and mid-afternoon.  Pt is eating as desired.   If additional nutrition issues arise, please consult RD.    Nickalaus Crooke, MS, RD, LDN, CNSC Inpatient Clinical Dietitian Pager # [phone] After hours/weekend pager # [phone]    

## 2018-06-11 NOTE — [facility] Group Notes (Signed)
[facility] Group Notes:  (Nursing/MHT/Case Management/Adjunct)  Date:  06/11/2018  Time:  5:07 PM  Type of Therapy:  Psychoeducational Skills  Participation Level:  Active  Participation Quality:  Appropriate  Affect:  Appropriate  Cognitive:  Alert and Appropriate  Insight:  Appropriate  Engagement in Group:  Engaged and Supportive  Modes of Intervention:  Activity and Support  Summary of Progress/Problems: Patient actively participated and was receptive.  Audrie Lializabeth O Marquise Lambson 06/11/2018, 5:07 PM

## 2018-06-11 NOTE — Plan of Care (Signed)
  Problem: Education: Goal: Ability to make informed decisions regarding treatment will improve Outcome: Not Progressing   Problem: Coping: Goal: Coping ability will improve Outcome: Not Progressing   

## 2018-06-11 NOTE — Progress Notes (Signed)
[facility] MD Progress Note  06/11/2018 3:26 PM Kylie Ross  MRN:  [id number] Subjective: Patient reports she is feeling better today-currently denies suicidal ideations.  Denies medication side effects.  Attributes depression and anxiety at least partially to the stressful job/employment she had and states she feels she made  right decision and recently quitting,  and that this is helping her feel calmer. Objective: I have discussed case with treatment team and have met with patient. 32 year old female, presented to the hospital due to worsening depression, anxiety, frequent panic attacks, thoughts of suicide. As above, endorses improvement compared to how she felt prior to admission, denies suicidal ideations at this time and is presenting with a full range of affect.  Expresses relief regarding having quit her job which she states was very stressful and contributing to her symptoms. Behavior on unit in good control, visible in dayroom. States she spoke with her mother via phone-mother has encouraged her to return to [place] to live with her, patient states she is  ambivalent about this option, but is considering it . Denies medication side effects, tolerating Zoloft well thus far.  BP trending down.   Principal Problem: Depression Diagnosis:   Patient Active Problem List   Diagnosis Date Noted  . MDD (major depressive disorder), severe ([place]) [F32.2] 06/09/2018   Total Time spent with patient: 20 minutes  Past Psychiatric History:   Past Medical History:  Past Medical History:  Diagnosis Date  . Hypertension    No past surgical history on file. Family History: No family history on file. Family Psychiatric  History:  Social History:  Social History   Substance and Sexual Activity  Alcohol Use No  . Alcohol/week: 0.0 oz     Social History   Substance and Sexual Activity  Drug Use No    Social History   Socioeconomic History  . Marital status: Unknown    Spouse name: Not on  file  . Number of children: Not on file  . Years of education: Not on file  . Highest education level: Not on file  Occupational History  . Not on file  Social Needs  . Financial resource strain: Not on file  . Food insecurity:    Worry: Not on file    Inability: Not on file  . Transportation needs:    Medical: Not on file    Non-medical: Not on file  Tobacco Use  . Smoking status: Former Research scientist (life sciences)  . Smokeless tobacco: Never Used  Substance and Sexual Activity  . Alcohol use: No    Alcohol/week: 0.0 oz  . Drug use: No  . Sexual activity: Yes    Birth control/protection: Pill  Lifestyle  . Physical activity:    Days per week: Not on file    Minutes per session: Not on file  . Stress: Not on file  Relationships  . Social connections:    Talks on phone: Not on file    Gets together: Not on file    Attends religious service: Not on file    Active member of club or organization: Not on file    Attends meetings of clubs or organizations: Not on file    Relationship status: Not on file  Other Topics Concern  . Not on file  Social History Narrative  . Not on file   Additional Social History:    Pain Medications: see MAR Prescriptions: see MAR Over the Counter: see MAR History of alcohol / drug use?: No history of alcohol /  drug abuse  Sleep: Good  Appetite:  Good  Current Medications: Current Facility-Administered Medications  Medication Dose Route Frequency Provider Last Rate Last Dose  . acetaminophen (TYLENOL) tablet 1,000 mg  1,000 mg Oral Q6H PRN Oney Folz, Myer Peer, MD   1,000 mg at 06/11/18 0801  . ALIGN CAPS 4 mg  4 mg Oral Daily Shaquaya Wuellner, Myer Peer, MD   4 mg at 06/11/18 1151  . alum & mag hydroxide-simeth (MAALOX/MYLANTA) 200-200-20 MG/5ML suspension 30 mL  30 mL Oral Q4H PRN Shiza Thelen, Myer Peer, MD   30 mL at 06/10/18 1849  . lisinopril (PRINIVIL,ZESTRIL) tablet 20 mg  20 mg Oral Daily Jennye Runquist, Myer Peer, MD   20 mg at 06/11/18 0801  . LORazepam (ATIVAN) tablet  0.5 mg  0.5 mg Oral Q6H PRN Eneida Evers, Myer Peer, MD   0.5 mg at 06/10/18 2117  . magnesium hydroxide (MILK OF MAGNESIA) suspension 30 mL  30 mL Oral Daily PRN Makinze Jani, Myer Peer, MD      . multivitamin with minerals tablet 1 tablet  1 tablet Oral Daily Angie Hogg, Myer Peer, MD   1 tablet at 06/11/18 0801  . sertraline (ZOLOFT) tablet 50 mg  50 mg Oral Daily Ardie Dragoo, Myer Peer, MD   50 mg at 06/11/18 0801  . traZODone (DESYREL) tablet 50 mg  50 mg Oral QHS Aveline Daus, Myer Peer, MD   50 mg at 06/10/18 2115    Lab Results:  Results for orders placed or performed during the hospital encounter of 06/09/18 (from the past 48 hour(s))  Urinalysis, Complete w Microscopic     Status: None   Collection Time: 06/10/18  1:39 PM  Result Value Ref Range   Color, Urine YELLOW YELLOW   APPearance CLEAR CLEAR   Specific Gravity, Urine 1.014 1.005 - 1.030   pH 6.0 5.0 - 8.0   Glucose, UA NEGATIVE NEGATIVE mg/dL   Hgb urine dipstick NEGATIVE NEGATIVE   Bilirubin Urine NEGATIVE NEGATIVE   Ketones, ur NEGATIVE NEGATIVE mg/dL   Protein, ur NEGATIVE NEGATIVE mg/dL   Nitrite NEGATIVE NEGATIVE   Leukocytes, UA NEGATIVE NEGATIVE   RBC / HPF 0-5 0 - 5 RBC/hpf   WBC, UA 0-5 0 - 5 WBC/hpf   Bacteria, UA NONE SEEN NONE SEEN   Squamous Epithelial / LPF 0-5 0 - 5   Mucus PRESENT     Comment: Performed at [facility], [place] [address], [place], [place] [address]  Pregnancy, urine     Status: None   Collection Time: 06/10/18  1:39 PM  Result Value Ref Range   Preg Test, Ur NEGATIVE NEGATIVE    Comment:        THE SENSITIVITY OF THIS METHODOLOGY IS >20 mIU/mL. Performed at [facility], [place] [address] [place], [place] [address]   Urine rapid drug screen (hosp performed)not at [facility]     Status: Abnormal   Collection Time: 06/10/18  1:39 PM  Result Value Ref Range   Opiates NONE DETECTED NONE DETECTED   Cocaine NONE DETECTED NONE DETECTED   Benzodiazepines NONE DETECTED NONE DETECTED    Amphetamines NONE DETECTED NONE DETECTED   Tetrahydrocannabinol POSITIVE (A) NONE DETECTED   Barbiturates (A) NONE DETECTED    Result not available. Reagent lot number recalled by manufacturer.    Comment: Performed at [facility], [place] [address], [place],  [address]  CBC     Status: None   Collection Time: 06/11/18  6:19 AM  Result Value  Ref Range   WBC 9.0 4.0 - 10.5 K/uL   RBC 4.55 3.87 - 5.11 MIL/uL   Hemoglobin 12.6 12.0 - 15.0 g/dL   HCT 37.7 36.0 - 46.0 %   MCV 82.9 78.0 - 100.0 fL   MCH 27.7 26.0 - 34.0 pg   MCHC 33.4 30.0 - 36.0 g/dL   RDW 14.4 11.5 - 15.5 %   Platelets 370 150 - 400 K/uL    Comment: Performed at [facility], [place] [address] [place], [place] [address]  Comprehensive metabolic panel     Status: Abnormal   Collection Time: 06/11/18  6:19 AM  Result Value Ref Range   Sodium 140 135 - 145 mmol/L   Potassium 4.1 3.5 - 5.1 mmol/L   Chloride 107 98 - 111 mmol/L    Comment: Please note change in reference range.   CO2 26 22 - 32 mmol/L   Glucose, Bld 125 (H) 70 - 99 mg/dL    Comment: Please note change in reference range.   BUN 7 6 - 20 mg/dL    Comment: Please note change in reference range.   Creatinine, Ser 0.91 0.44 - 1.00 mg/dL   Calcium 8.9 8.9 - 10.3 mg/dL   Total Protein 7.4 6.5 - 8.1 g/dL   Albumin 3.7 3.5 - 5.0 g/dL   AST 17 15 - 41 U/L   ALT 14 0 - 44 U/L    Comment: Please note change in reference range.   Alkaline Phosphatase 60 38 - 126 U/L   Total Bilirubin 0.4 0.3 - 1.2 mg/dL   GFR calc non Af Amer >60 >60 mL/min   GFR calc Af Amer >60 >60 mL/min    Comment: (NOTE) The eGFR has been calculated using the CKD EPI equation. This calculation has not been validated in all clinical situations. eGFR's persistently <60 mL/min signify possible Chronic Kidney Disease.    Anion gap 7 5 - 15    Comment: Performed at [facility], [place] [address], [place], [place] [address]   Hemoglobin A1c     Status: Abnormal   Collection Time: 06/11/18  6:19 AM  Result Value Ref Range   Hgb A1c MFr Bld 6.5 (H) 4.8 - 5.6 %    Comment: (NOTE) Pre diabetes:          5.7%-6.4% Diabetes:              >6.4% Glycemic control for   <7.0% adults with diabetes    Mean Plasma Glucose 139.85 mg/dL    Comment: Performed at [place] [address] [place], [place] [address]  Ethanol     Status: None   Collection Time: 06/11/18  6:19 AM  Result Value Ref Range   Alcohol, Ethyl (B) <10 <10 mg/dL    Comment: (NOTE) Lowest detectable limit for serum alcohol is 10 mg/dL. For medical purposes only. Performed at [facility], [place] [address], [place], [place] [address]   Lipid panel     Status: Abnormal   Collection Time: 06/11/18  6:19 AM  Result Value Ref Range   Cholesterol 163 0 - 200 mg/dL   Triglycerides 164 (H) <150 mg/dL   HDL 50 >40 mg/dL   Total CHOL/HDL Ratio 3.3 RATIO   VLDL 33 0 - 40 mg/dL   LDL Cholesterol 80 0 - 99 mg/dL    Comment:        Total Cholesterol/HDL:CHD Risk Coronary Heart Disease Risk Table  Men   Women  1/2 Average Risk   3.4   3.3  Average Risk       5.0   4.4  2 X Average Risk   9.6   7.1  3 X Average Risk  23.4   11.0        Use the calculated Patient Ratio above and the CHD Risk Table to determine the patient's CHD Risk.        ATP III CLASSIFICATION (LDL):  <100     mg/dL   Optimal  100-129  mg/dL   Near or Above                    Optimal  130-159  mg/dL   Borderline  160-189  mg/dL   High  >190     mg/dL   Very High Performed at [place] [address], [place], [place] [address]   TSH     Status: None   Collection Time: 06/11/18  6:19 AM  Result Value Ref Range   TSH 2.087 0.350 - 4.500 uIU/mL    Comment: Performed by a 3rd Generation assay with a functional sensitivity of <=0.01 uIU/mL. Performed at [facility], [place] [address]  [place], [place] [address]     Blood Alcohol level:  Lab Results  Component Value Date   ETH <10 30/16/0109    Metabolic Disorder Labs: Lab Results  Component Value Date   HGBA1C 6.5 (H) 06/11/2018   MPG 139.85 06/11/2018   No results found for: PROLACTIN Lab Results  Component Value Date   CHOL 163 06/11/2018   TRIG 164 (H) 06/11/2018   HDL 50 06/11/2018   CHOLHDL 3.3 06/11/2018   VLDL 33 06/11/2018   LDLCALC 80 06/11/2018    Physical Findings: AIMS: Facial and Oral Movements Muscles of Facial Expression: None, normal Lips and Perioral Area: None, normal Jaw: None, normal Tongue: None, normal,Extremity Movements Upper (arms, wrists, hands, fingers): None, normal Lower (legs, knees, ankles, toes): None, normal, Trunk Movements Neck, shoulders, hips: None, normal, Overall Severity Severity of abnormal movements (highest score from questions above): None, normal Incapacitation due to abnormal movements: None, normal Patient's awareness of abnormal movements (rate only patient's report): No Awareness, Dental Status Current problems with teeth and/or dentures?: No Does patient usually wear dentures?: No  CIWA:  CIWA-Ar Total: 0 COWS:  COWS Total Score: 1  Musculoskeletal: Strength & Muscle Tone: within normal limits Gait & Station: normal Patient leans: N/A  Psychiatric Specialty Exam: Physical Exam  ROS denies headache, no chest pain, no shortness of breath, no vomiting  Blood pressure (!) 155/92, pulse 72, temperature 98.6 F (37 C), temperature source Oral, resp. rate 18, SpO2 99 %.There is no height or weight on file to calculate BMI.  General Appearance: Well Groomed  Eye Contact:  Good  Speech:  Normal Rate  Volume:  Normal  Mood:  Reports improved mood  Affect:  Appropriate and fully reactive  Thought Process:  Linear and Descriptions of Associations: Intact  Orientation:  Full (Time, Place, and Person)  Thought Content:  No hallucinations, no delusions  expressed  Suicidal Thoughts:  No currently denies any self-injurious or suicidal ideations and contracts for safety on unit  Homicidal Thoughts:  No  Memory:  Recent and remote grossly intact  Judgement:  Other:  Improving  Insight:  Fair improving-  Psychomotor Activity:  Normal  Concentration:  Concentration: Good and Attention Span: Good  Recall:  Good  Fund of Knowledge:  Good  Language:  Good  Akathisia:  Negative  Handed:  Right  AIMS (if indicated):     Assets:  Communication Skills Desire for Improvement Resilience  ADL's:  Intact  Cognition:  WNL  Sleep:  Number of Hours: 6.75   Assessment: Patient presents with improved mood and fuller range of affect.  At this time denies suicidal ideations and presents future oriented, currently considering relocating to [place] to live with her mother.  She states that stressful job was a  major issue which was contributing to her increased anxiety and depression and describes a sense of relief following her decision to quit this job recently.  She is currently on Zoloft which she is tolerating well thus far.  Treatment Plan Summary: Daily contact with patient to assess and evaluate symptoms and progress in treatment, Medication management, Plan Inpatient treatment and Medications as below Encourage group and milieu participation to work on coping skills and symptom reduction Treatment team working on disposition plan Continue lisinopril at 20 mg daily for hypertension Continue Zoloft 50 mg daily for depression and anxiety Continue Trazodone 50 mg nightly for insomnia as needed Continue Ativan 0.5 mg every 6 hours as needed for anxiety  Jenne Campus, MD 06/11/2018, 3:26 PM

## 2018-06-12 MED ORDER — IBUPROFEN 600 MG PO TABS
600.0000 mg | ORAL_TABLET | Freq: Four times a day (QID) | ORAL | Status: DC | PRN
  Administered 2018-06-12: 600 mg via ORAL
  Filled 2018-06-12: qty 1

## 2018-06-12 MED ORDER — NEOMYCIN-POLYMYXIN-HC 3.5-10000-1 OT SUSP
3.0000 [drp] | Freq: Four times a day (QID) | OTIC | Status: DC
  Administered 2018-06-12 – 2018-06-13 (×2): 3 [drp] via OTIC
  Filled 2018-06-12 (×2): qty 10

## 2018-06-12 MED ORDER — CARBAMIDE PEROXIDE 6.5 % OT SOLN
5.0000 [drp] | Freq: Two times a day (BID) | OTIC | Status: DC
  Administered 2018-06-12: 5 [drp] via OTIC
  Filled 2018-06-12: qty 15

## 2018-06-12 MED ORDER — TRAZODONE HCL 50 MG PO TABS
50.0000 mg | ORAL_TABLET | Freq: Every evening | ORAL | Status: DC | PRN
  Administered 2018-06-12: 50 mg via ORAL
  Filled 2018-06-12 (×2): qty 1

## 2018-06-12 NOTE — Progress Notes (Signed)
1:1 Note Pt is currently a sleep unlabored respiration. No unwanted behavior observed or reported. Staff remains in the room with the pt, will continue to monitor.

## 2018-06-12 NOTE — Progress Notes (Signed)
1:1 observation note:  Patient asleep at this time.  Patient's 72 hour discharge request is up today.  She is probably not going to be a discharge per MD due to behavior last night.  Patient broke a door and could not contract for safety.  Patient will need to rescind the 72 hour request today unless she is involuntarily committed.  Continue to assess.  Medications held until patient wakes.

## 2018-06-12 NOTE — Progress Notes (Signed)
[facility] Post 1:1 Observation Documentation  For the first (8) hours following discontinuation of 1:1 precautions, a progress note entry by nursing staff should be documented at least every 2 hours, reflecting the patient's behavior, condition, mood, and conversation.  Use the progress notes for additional entries.  Time 1:1 discontinued:  1115  Patient's Behavior:  Calm and cooperative.  Patient's Condition:  Patient went outside for Rec Therapy.  She is tearful and interacting with her peers.  Patient's Conversation:  Patient asked if this nurse would call her aunt and inform her to bring her book bag and purse.  Separate note regarding conversation.  Continue to assess.  Cranford MonBeaudry, Lebert Lovern Evans 06/12/2018, 11:52 AM

## 2018-06-12 NOTE — Progress Notes (Signed)
[facility] Post 1:1 Observation Documentation  For the first (8) hours following discontinuation of 1:1 precautions, a progress note entry by nursing staff should be documented at least every 2 hours, reflecting the patient's behavior, condition, mood, and conversation.  Use the progress notes for additional entries.  Time 1:1 discontinued:  1115   Patient's Behavior:  Calm and interacting with her peers.  Patient's Condition:  She is interacting well with staff.  She is physically stable.  Her mood is stable.  Patient's Conversation:  Patient is focused on discharge.  Cranford MonBeaudry, Briella Hobday Evans 06/12/2018, 5:18 PM

## 2018-06-12 NOTE — Progress Notes (Signed)
1;1 Note  Pt observed resting in the bed with eyes closed and even respiration. No sign labored breathing. Staff remain in the room with pt, will continue to monitor.

## 2018-06-12 NOTE — Progress Notes (Signed)
Writer sat with pt in order to help deescalate. Contacted pt's "aunt" in order to get a telephone number for a "friend" that was keeping the pt's dog. 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 Stated that pt has been able to manipulate counselors in the past and get discharged earlier than expected. Fayne NorrieLisa Taylor, ('aunt") states she is not a "blood relative" of the pt. Stated she good friends with the pt's mother. 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 Ms Ladona Ridgelaylor informed the Clinical research associatewriter that the pt did embezzle from [employer] as well as her other place of employment. Stated, pt is feeling "guilty".  Ms Lubertha Basqueaylor's number is [phone]

## 2018-06-12 NOTE — [facility] Group Notes (Signed)
[facility] LCSW Group Therapy Note  Date/Time: 06/12/18, 1315  Type of Therapy/Topic:  Group Therapy:  Feelings about Diagnosis  Participation Level:  Active   Mood:pleasant  Description of Group:    This group will allow patients to explore their thoughts and feelings about diagnoses they have received. Patients will be guided to explore their level of understanding and acceptance of these diagnoses. Facilitator will encourage patients to process their thoughts and feelings about the reactions of others to their diagnosis, and will guide patients in identifying ways to discuss their diagnosis with significant others in their lives. This group will be process-oriented, with patients participating in exploration of their own experiences as well as giving and receiving support and challenge from other group members.   Therapeutic Goals: 1. Patient will demonstrate understanding of diagnosis as evidence by identifying two or more symptoms of the disorder:  2. Patient will be able to express two feelings regarding the diagnosis 3. Patient will demonstrate ability to communicate their needs through discussion and/or role plays  Summary of Patient Progress: Pt active participant in group regarding the above topic.  Pt  Very engaged and open in sharing her personal story.          Therapeutic Modalities:   Cognitive Behavioral Therapy Brief Therapy Feelings Identification   Daleen SquibbGreg Brinlyn Cena, LCSW

## 2018-06-12 NOTE — Plan of Care (Signed)
  Problem: Education: Goal: Ability to make informed decisions regarding treatment will improve Outcome: Progressing   Problem: Coping: Goal: Coping ability will improve Outcome: Progressing   Problem: Health Behavior/Discharge Planning: Goal: Identification of resources available to assist in meeting health care needs will improve Outcome: Progressing   Problem: Medication: Goal: Compliance with prescribed medication regimen will improve Outcome: Progressing   

## 2018-06-12 NOTE — Progress Notes (Addendum)
[facility] MD Progress Note  06/12/2018 10:48 AM Stella SHAE HINNENKAMP  MRN:  [id number] Subjective: patient reports she still feels upset , but acknowledges she feels better this AM. Denies suicidal ideations and contracts for safety. Denies medication side effects. Objective: I have discussed case with treatment team and have met with patient. 32 year old female, presented to the hospital due to worsening depression, anxiety, frequent panic attacks, thoughts of suicide. Patient had an episode of acute agitation/anger yesterday evening, during which she punched a window, needed staff intervention. This AM presents calm, without any psychomotor agitation or restlessness . Explains she had been trying to reach her aunt on the phone , was unable to, so called her mother. States mother told her that her situation was causing a lot of stress and embarrassment  to the family . (reports  that there is an investigation going on alleging that she had forged some documents at work.) States she felt acutely  upset because " I felt that all they are worried about is the family image and what people will think".   As above, today acknowledging feeling " a little better", and presenting calm, not restless or agitated. She denies suicidal ideations and states " what I really want to do is to go home and play with my dog " . Also states she plans to go to an IOP program after discharge  No current medication side effects reported . Behavior this AM calm, no psychomotor agitation at this time.  Denies suicidal ideations at present .  Labs reviewed-  Serum glucose 125, HgbA1C 6.5 . Have reviewed with hospitalist consultant- no medication indicated at this time, initial treatment should be diet /exercise based- patient to ollow up with PCP for further monitoring  Principal Problem: Depression Diagnosis:   Patient Active Problem List   Diagnosis Date Noted  . MDD (major depressive disorder), severe ([place]) [F32.2] 06/09/2018    Total Time spent with patient: 20 minutes  Past Psychiatric History:   Past Medical History:  Past Medical History:  Diagnosis Date  . Hypertension    No past surgical history on file. Family History: No family history on file. Family Psychiatric  History:  Social History:  Social History   Substance and Sexual Activity  Alcohol Use No  . Alcohol/week: 0.0 oz     Social History   Substance and Sexual Activity  Drug Use No    Social History   Socioeconomic History  . Marital status: Unknown    Spouse name: Not on file  . Number of children: Not on file  . Years of education: Not on file  . Highest education level: Not on file  Occupational History  . Not on file  Social Needs  . Financial resource strain: Not on file  . Food insecurity:    Worry: Not on file    Inability: Not on file  . Transportation needs:    Medical: Not on file    Non-medical: Not on file  Tobacco Use  . Smoking status: Former Research scientist (life sciences)  . Smokeless tobacco: Never Used  Substance and Sexual Activity  . Alcohol use: No    Alcohol/week: 0.0 oz  . Drug use: No  . Sexual activity: Yes    Birth control/protection: Pill  Lifestyle  . Physical activity:    Days per week: Not on file    Minutes per session: Not on file  . Stress: Not on file  Relationships  . Social connections:    Talks on  phone: Not on file    Gets together: Not on file    Attends religious service: Not on file    Active member of club or organization: Not on file    Attends meetings of clubs or organizations: Not on file    Relationship status: Not on file  Other Topics Concern  . Not on file  Social History Narrative  . Not on file   Additional Social History:    Pain Medications: see MAR Prescriptions: see MAR Over the Counter: see MAR History of alcohol / drug use?: No history of alcohol / drug abuse  Sleep: Good  Appetite:  Good  Current Medications: Current Facility-Administered Medications   Medication Dose Route Frequency Provider Last Rate Last Dose  . acetaminophen (TYLENOL) tablet 1,000 mg  1,000 mg Oral Q6H PRN Cobos, Myer Peer, MD   1,000 mg at 06/11/18 0801  . ALIGN CAPS 4 mg  4 mg Oral Daily Cobos, Myer Peer, MD   4 mg at 06/12/18 1031  . alum & mag hydroxide-simeth (MAALOX/MYLANTA) 200-200-20 MG/5ML suspension 30 mL  30 mL Oral Q4H PRN Cobos, Myer Peer, MD   30 mL at 06/10/18 1849  . lisinopril (PRINIVIL,ZESTRIL) tablet 20 mg  20 mg Oral Daily Cobos, Myer Peer, MD   20 mg at 06/12/18 1031  . LORazepam (ATIVAN) tablet 0.5 mg  0.5 mg Oral Q6H PRN Cobos, Myer Peer, MD   0.5 mg at 06/11/18 1955  . magnesium hydroxide (MILK OF MAGNESIA) suspension 30 mL  30 mL Oral Daily PRN Cobos, Myer Peer, MD      . multivitamin with minerals tablet 1 tablet  1 tablet Oral Daily Cobos, Myer Peer, MD   1 tablet at 06/12/18 1031  . sertraline (ZOLOFT) tablet 50 mg  50 mg Oral Daily Cobos, Myer Peer, MD   50 mg at 06/12/18 1032  . traZODone (DESYREL) tablet 50 mg  50 mg Oral QHS,MR X 1 Lindon Romp A, NP   50 mg at 06/11/18 2323    Lab Results:  Results for orders placed or performed during the hospital encounter of 06/09/18 (from the past 48 hour(s))  Urinalysis, Complete w Microscopic     Status: None   Collection Time: 06/10/18  1:39 PM  Result Value Ref Range   Color, Urine YELLOW YELLOW   APPearance CLEAR CLEAR   Specific Gravity, Urine 1.014 1.005 - 1.030   pH 6.0 5.0 - 8.0   Glucose, UA NEGATIVE NEGATIVE mg/dL   Hgb urine dipstick NEGATIVE NEGATIVE   Bilirubin Urine NEGATIVE NEGATIVE   Ketones, ur NEGATIVE NEGATIVE mg/dL   Protein, ur NEGATIVE NEGATIVE mg/dL   Nitrite NEGATIVE NEGATIVE   Leukocytes, UA NEGATIVE NEGATIVE   RBC / HPF 0-5 0 - 5 RBC/hpf   WBC, UA 0-5 0 - 5 WBC/hpf   Bacteria, UA NONE SEEN NONE SEEN   Squamous Epithelial / LPF 0-5 0 - 5   Mucus PRESENT     Comment: Performed at [facility], [place] [address], [place], [place] [address]   Pregnancy, urine     Status: None   Collection Time: 06/10/18  1:39 PM  Result Value Ref Range   Preg Test, Ur NEGATIVE NEGATIVE    Comment:        THE SENSITIVITY OF THIS METHODOLOGY IS >20 mIU/mL. Performed at [facility], [place] [address] [place], [place] [address]   Urine rapid drug screen (hosp performed)not at [facility]  Status: Abnormal   Collection Time: 06/10/18  1:39 PM  Result Value Ref Range   Opiates NONE DETECTED NONE DETECTED   Cocaine NONE DETECTED NONE DETECTED   Benzodiazepines NONE DETECTED NONE DETECTED   Amphetamines NONE DETECTED NONE DETECTED   Tetrahydrocannabinol POSITIVE (A) NONE DETECTED   Barbiturates (A) NONE DETECTED    Result not available. Reagent lot number recalled by manufacturer.    Comment: Performed at [facility], [place] [address] [place], [place] [address]  CBC     Status: None   Collection Time: 06/11/18  6:19 AM  Result Value Ref Range   WBC 9.0 4.0 - 10.5 K/uL   RBC 4.55 3.87 - 5.11 MIL/uL   Hemoglobin 12.6 12.0 - 15.0 g/dL   HCT 37.7 36.0 - 46.0 %   MCV 82.9 78.0 - 100.0 fL   MCH 27.7 26.0 - 34.0 pg   MCHC 33.4 30.0 - 36.0 g/dL   RDW 14.4 11.5 - 15.5 %   Platelets 370 150 - 400 K/uL    Comment: Performed at [facility], [place] [address] [place], [place] [address]  Comprehensive metabolic panel     Status: Abnormal   Collection Time: 06/11/18  6:19 AM  Result Value Ref Range   Sodium 140 135 - 145 mmol/L   Potassium 4.1 3.5 - 5.1 mmol/L   Chloride 107 98 - 111 mmol/L    Comment: Please note change in reference range.   CO2 26 22 - 32 mmol/L   Glucose, Bld 125 (H) 70 - 99 mg/dL    Comment: Please note change in reference range.   BUN 7 6 - 20 mg/dL    Comment: Please note change in reference range.   Creatinine, Ser 0.91 0.44 - 1.00 mg/dL   Calcium 8.9 8.9 - 10.3 mg/dL   Total Protein 7.4 6.5 - 8.1 g/dL   Albumin 3.7 3.5 - 5.0 g/dL   AST 17 15 - 41 U/L   ALT 14 0 - 44  U/L    Comment: Please note change in reference range.   Alkaline Phosphatase 60 38 - 126 U/L   Total Bilirubin 0.4 0.3 - 1.2 mg/dL   GFR calc non Af Amer >60 >60 mL/min   GFR calc Af Amer >60 >60 mL/min    Comment: (NOTE) The eGFR has been calculated using the CKD EPI equation. This calculation has not been validated in all clinical situations. eGFR's persistently <60 mL/min signify possible Chronic Kidney Disease.    Anion gap 7 5 - 15    Comment: Performed at [facility], [place] [address], [place], [place] [address]  Hemoglobin A1c     Status: Abnormal   Collection Time: 06/11/18  6:19 AM  Result Value Ref Range   Hgb A1c MFr Bld 6.5 (H) 4.8 - 5.6 %    Comment: (NOTE) Pre diabetes:          5.7%-6.4% Diabetes:              >6.4% Glycemic control for   <7.0% adults with diabetes    Mean Plasma Glucose 139.85 mg/dL    Comment: Performed at [place] [address] [place], [place] [address]  Ethanol     Status: None   Collection Time: 06/11/18  6:19 AM  Result Value Ref Range   Alcohol, Ethyl (B) <10 <10 mg/dL    Comment: (NOTE) Lowest detectable limit for serum alcohol is 10 mg/dL. For  medical purposes only. Performed at [facility], [place] [address], [place], [place] [address]   Lipid panel     Status: Abnormal   Collection Time: 06/11/18  6:19 AM  Result Value Ref Range   Cholesterol 163 0 - 200 mg/dL   Triglycerides 164 (H) <150 mg/dL   HDL 50 >40 mg/dL   Total CHOL/HDL Ratio 3.3 RATIO   VLDL 33 0 - 40 mg/dL   LDL Cholesterol 80 0 - 99 mg/dL    Comment:        Total Cholesterol/HDL:CHD Risk Coronary Heart Disease Risk Table                     Men   Women  1/2 Average Risk   3.4   3.3  Average Risk       5.0   4.4  2 X Average Risk   9.6   7.1  3 X Average Risk  23.4   11.0        Use the calculated Patient Ratio above and the CHD Risk Table to determine the patient's CHD Risk.        ATP III CLASSIFICATION  (LDL):  <100     mg/dL   Optimal  100-129  mg/dL   Near or Above                    Optimal  130-159  mg/dL   Borderline  160-189  mg/dL   High  >190     mg/dL   Very High Performed at [place] [address] [place], [place] [address]   TSH     Status: None   Collection Time: 06/11/18  6:19 AM  Result Value Ref Range   TSH 2.087 0.350 - 4.500 uIU/mL    Comment: Performed by a 3rd Generation assay with a functional sensitivity of <=0.01 uIU/mL. Performed at [facility], [place] [address], [place], [place] [address]     Blood Alcohol level:  Lab Results  Component Value Date   ETH <10 32/35/5732    Metabolic Disorder Labs: Lab Results  Component Value Date   HGBA1C 6.5 (H) 06/11/2018   MPG 139.85 06/11/2018   No results found for: PROLACTIN Lab Results  Component Value Date   CHOL 163 06/11/2018   TRIG 164 (H) 06/11/2018   HDL 50 06/11/2018   CHOLHDL 3.3 06/11/2018   VLDL 33 06/11/2018   LDLCALC 80 06/11/2018    Physical Findings: AIMS: Facial and Oral Movements Muscles of Facial Expression: None, normal Lips and Perioral Area: None, normal Jaw: None, normal Tongue: None, normal,Extremity Movements Upper (arms, wrists, hands, fingers): None, normal Lower (legs, knees, ankles, toes): None, normal, Trunk Movements Neck, shoulders, hips: None, normal, Overall Severity Severity of abnormal movements (highest score from questions above): None, normal Incapacitation due to abnormal movements: None, normal Patient's awareness of abnormal movements (rate only patient's report): No Awareness, Dental Status Current problems with teeth and/or dentures?: No Does patient usually wear dentures?: No  CIWA:  CIWA-Ar Total: 0 COWS:  COWS Total Score: 1  Musculoskeletal: Strength & Muscle Tone: within normal limits Gait & Station: normal Patient leans: N/A  Psychiatric Specialty Exam: Physical Exam  ROS denies headache, no chest  pain, no shortness of breath, no vomiting  Blood pressure (!) 133/99, pulse 88, temperature 98.2 F (36.8 C), temperature source Oral, resp. rate 16, SpO2 99 %.There is no height or weight  on file to calculate BMI.  General Appearance: Well Groomed  Eye Contact:  Good  Speech:  Normal Rate  Volume:  Normal  Mood:  reports feeling depressed, but better this AM   Affect:  constricted  Thought Process:  Linear and Descriptions of Associations: Intact  Orientation:  Full (Time, Place, and Person)  Thought Content:  No hallucinations, no delusions expressed  Suicidal Thoughts:  No currently denies any self-injurious or suicidal ideations and contracts for safety on unit  Homicidal Thoughts:  No  Memory:  Recent and remote grossly intact  Judgement:  Fair  Insight:  Fair   Psychomotor Activity:  Normal  Concentration:  Concentration: Good and Attention Span: Good  Recall:  Good  Fund of Knowledge:  Good  Language:  Good  Akathisia:  Negative  Handed:  Right  AIMS (if indicated):     Assets:  Communication Skills Desire for Improvement Resilience  ADL's:  Intact  Cognition:  WNL  Sleep:  Number of Hours: 6   Assessment: patient had presented improved and with brighter affect yesterday afternoon. Episode of acute agitation/anger yesterday evening which was acute and  in the context of phone call with family ( mother, aunt ). Describes  that there is an investigation going on accusing her of wrongdoing at work and that this is causing significant family stress. Today calm, behavior in control, denies SI, but presents with subdued, constricted affect. Of note, was placed on one to one observation last evening due to above described episode, but is currently calm, behavior in control, and denies any SI or self injurious ideations.  Tolerating medications well thus far  .   Treatment Plan Summary: Daily contact with patient to assess and evaluate symptoms and progress in treatment, Medication  management, Plan Inpatient treatment and Medications as below  Treatment Plan reviewed as below today 7/12 Encourage group and milieu participation to work on coping skills and symptom reduction Treatment team working on disposition plan Continue Lisinopril at 20 mg daily for hypertension Continue Zoloft 50 mg daily for depression and anxiety Continue Trazodone 50 mg nightly for insomnia as needed Continue Ativan 0.5 mg every 6 hours as needed for anxiety As discussed with staff/ team, will discontinue one to one observation at this time.   Jenne Campus, MD 06/12/2018, 10:48 AM   Patient ID: Sima Matas, female   DOB: [birth year], 32 y.o.   MRN: [id number]

## 2018-06-12 NOTE — Progress Notes (Signed)
1:1 discontinued.  Patient rescinded her 72 hour discharge.  Patient feels that she can control her behavior.  She did go outside for recreation therapy.  She is calm and cooperative.  She asked if I would call her aunt because she wants her purse and book bag.

## 2018-06-12 NOTE — Progress Notes (Signed)
[facility] Post 1:1 Observation Documentation  For the first (8) hours following discontinuation of 1:1 precautions, a progress note entry by nursing staff should be documented at least every 2 hours, reflecting the patient's behavior, condition, mood, and conversation.  Use the progress notes for additional entries.  Time 1:1 discontinued:  1115   Patient's Behavior:  Calm and cooperative.  Her affect is bright.  Patient's Condition:  Patient physically stable.  She is interacting well with her peers.    Patient's Conversation:  Patient is aware that her mother if flying in on Sunday.  She is focused on discharge for tomorrow.    Cranford MonBeaudry, Zelphia Glover Evans 06/12/2018, 2:25 PM

## 2018-06-12 NOTE — Progress Notes (Signed)
[facility] Post 1:1 Observation Documentation  For the first (8) hours following discontinuation of 1:1 precautions, a progress note entry by nursing staff should be documented at least every 2 hours, reflecting the patient's behavior, condition, mood, and conversation.  Use the progress notes for additional entries.  Time 1:1 discontinued:  1115   Patient's Behavior:  Calm and cooperative.  Pleasant.  Patient's Condition:  Physically stable.  Patient's Conversation:  She is focused on discharge.  Denies any thoughts of self harm.  Is aware that her mother is flying in on Sunday from [place]. Possible discharge tomorrow.  Kylie Ross, Kylie Ross 06/12/2018, 7:04 PM

## 2018-06-12 NOTE — Progress Notes (Signed)
[facility] Post 1:1 Observation Documentation  For the first (8) hours following discontinuation of 1:1 precautions, a progress note entry by nursing staff should be documented at least every 2 hours, reflecting the patient's behavior, condition, mood, and conversation.  Use the progress notes for additional entries.  Time 1:1 discontinued:  1115   Patient's Behavior:  Patient is observed in day room interacting with her peers and coloring.  She is calm and cooperative.  Her affect is bright.  Patient's Condition:  Patient physically stable.  Her mood is brighter.  Patient's Conversation:  Patient is pleasant with staff.  She requested ear drops which were ordered.  Cranford MonBeaudry, Lajada Janes Evans 06/12/2018, 3:18 PM

## 2018-06-12 NOTE — Progress Notes (Addendum)
D: Patient denies any thoughts of self harm today.  She spoke with Dr. Jama Flavorsobos regarding discharge.  She is a possible discharge for tomorrow.  Spoke with aunt who gave information regarding patient's job.  Since the police was called, they followed up with her place of employment.  The aunt (is actually her pastor) states that the patient has lost her job, however, there are no charges pending.  She is concerned about the patient discharging.  She states the patient's mother will fly in on Sunday at 1600.  She expressed her concern over the patient leaving before the mother arrives to take her back to [place]. Patient has requested that if her mother or father calls, she does not want to speak with them.  Informed Dr. Jama Flavorsobos and social worker Jolan of same information.  Patient is calm and cooperative at this time.  1:1 notes followed up for next 8 hours regarding behavior.  A: Continue to monitor medication management and MD orders.  Safety checks completed every 15 minutes per protocol.  Offer support and encouragement as needed.  R: Patient is receptive to staff; her behavior is appropriate.

## 2018-06-12 NOTE — Progress Notes (Signed)
The patient mentioned that she had a good day since she went outside to play kick ball and because she enjoyed talking to the peer support specialist. She feels that the peer support person listened to her more than anyone else. Her goal for tomorrow is to get discharged.

## 2018-06-12 NOTE — [facility] Suicide Risk Assessment (Addendum)
[facility] INPATIENT:  Family/Significant Other Suicide Prevention Education  Suicide Prevention Education:  Contact Attempts:Lisa Ladona Ridgelaylor, [phone]([phone]) has been identified by the patient as the family member/significant other with whom the patient will be residing, and identified as the person(s) who will aid the patient in the event of a mental health crisis.  With written consent from the patient, two attempts were made to provide suicide prevention education, prior to and/or following the patient's discharge.  We were unsuccessful in providing suicide prevention education.  A suicide education pamphlet was given to the patient to share with family/significant other.  Date and time of first attempt:06/11/18 / 2:10pm  Date and time of second attempt:  06/13/2018   10:50am  - Contact was made with patient's aunt, Fayne NorrieLisa Taylor, but she could not talke at that time due to being in church.  She said she would review the information given to patient to share with her.  She expressed no concerns about patient discharging.     Maeola SarahJolan E Williams 06/12/2018, 11:00 AM   Ambrose MantleMareida Grossman-Orr, LCSW 06/13/2018, 10:59 AM

## 2018-06-12 NOTE — Progress Notes (Signed)
Recreation Therapy Notes  Date: 7.12.19 Time: 0930 Location: [address] Dayroom  Group Topic: Stress Management  Goal Area(s) Addresses:  Patient will verbalize importance of using healthy stress management.  Patient will identify positive emotions associated with healthy stress management.   Intervention: Stress Management  Activity :  Meditation.  LRT introduced the stress management technique of meditation.  LRT played a meditation on being resilient.  Patients were to listen to the meditation and follow along as meditation played.  Education:  Stress Management, Discharge Planning.   Education Outcome: Acknowledges edcuation/In group clarification offered/Needs additional education  Clinical Observations/Feedback: Pt did not attend group.    Caroll RancherMarjette Militza Devery, LRT/CTRS         Lillia AbedLindsay, Kaya Klausing A 06/12/2018 11:40 AM

## 2018-06-13 MED ORDER — TRAZODONE HCL 50 MG PO TABS: 50.0000 mg | ORAL_TABLET | Freq: Every evening | ORAL | 0 refills | Status: AC | PRN

## 2018-06-13 MED ORDER — SERTRALINE HCL 50 MG PO TABS: 50.0000 mg | ORAL_TABLET | Freq: Every day | ORAL | 0 refills | Status: AC

## 2018-06-13 NOTE — [facility] Group Notes (Signed)
LCSW Group Therapy Note  06/13/2018    10:30-11:30am   Type of Therapy and Topic:  Group Therapy: Anger and Coping Skills  Participation Level:  Active   Description of Group:   In this group, patients learned how to recognize the physical, cognitive, emotional, and behavioral responses they have to anger-provoking situations.  They identified how they usually or often react when angered, and learned how healthy and unhealthy coping skills work initially, but the unhealthy ones stop working.   They analyzed how their frequently-chosen coping skill is possibly beneficial and how it is possibly unhelpful.  The group discussed a variety of healthier coping skills that could help in resolving the actual issues, as well as how to go about planning for the the possibility of future similar situations.  Therapeutic Goals: 1. Patients will identify one thing that makes them angry and how they feel emotionally and physically, what their thoughts are or tend to be in those situations, and what healthy or unhealthy coping mechanism they typically use 2. Patients will identify how their coping technique works for them, as well as how it works against them. 3. Patients will explore possible new behaviors to use in future anger situations. 4. Patients will learn that anger itself is normal and cannot be eliminated, and that healthier coping skills can assist with resolving conflict rather than worsening situations.  Summary of Patient Progress:  The patient shared that she often goes into rages when angry.  She talked with humor about her actions in the hospital.  Therapeutic Modalities:   Cognitive Behavioral Therapy Motivation Interviewing  Lynnell ChadMareida J Grossman-Orr  .

## 2018-06-13 NOTE — Progress Notes (Signed)
Discharge note:  Patient discharged per MD order.  Patient denies any thoughts of self harm.  She feels she is ready for discharge today.  She asked if staff could call her aunt because she had her purse and book bag.  Patient will follow up with outpatient hospitalization.  She received all her personal belongings.  Patient received her prescriptions.  She left ambulatory with a friend.

## 2018-06-13 NOTE — Discharge Summary (Addendum)
Physician Discharge Summary Note  Patient:  Kylie Ross is an 32 y.o., female MRN:  [id number] DOB:  Apr 04, 1986 Patient phone:  [phone] (home)  Patient address:   [address] [place] [address],  Total Time spent with patient: 20 minutes  Date of Admission:  06/09/2018 Date of Discharge: 06/13/2018  Reason for Admission:  Per assessment note:32 year old female , presented to the hospital voluntarily at the encouragement of her family, due to worsening depression, suicidal ideations ,increased anxiety, increased frequency of panic attacks, along with thoughts of crashing her car or carrying a BB gun so that  someone might shoot her.  Attributes depression, anxiety mostly to work related stressors. States her job was very stressful in particular because she was working for very rich,wealthy clients, due to which she had decided to quit her job yesterday.  Principal Problem: MDD (major depressive disorder), severe [facility]) Discharge Diagnoses: Patient Active Problem List   Diagnosis Date Noted  . MDD (major depressive disorder), severe (HCC) [F32.2] 06/09/2018    Past Psychiatric History:   Past Medical History:  Past Medical History:  Diagnosis Date  . Hypertension    No past surgical history on file. Family History: No family history on file. Family Psychiatric  History:  Social History:  Social History   Substance and Sexual Activity  Alcohol Use No  . Alcohol/week: 0.0 oz     Social History   Substance and Sexual Activity  Drug Use No    Social History   Socioeconomic History  . Marital status: Unknown    Spouse name: Not on file  . Number of children: Not on file  . Years of education: Not on file  . Highest education level: Not on file  Occupational History  . Not on file  Social Needs  . Financial resource strain: Not on file  . Food insecurity:    Worry: Not on file    Inability: Not on file  . Transportation needs:    Medical: Not on file     Non-medical: Not on file  Tobacco Use  . Smoking status: Former Games developer  . Smokeless tobacco: Never Used  Substance and Sexual Activity  . Alcohol use: No    Alcohol/week: 0.0 oz  . Drug use: No  . Sexual activity: Yes    Birth control/protection: Pill  Lifestyle  . Physical activity:    Days per week: Not on file    Minutes per session: Not on file  . Stress: Not on file  Relationships  . Social connections:    Talks on phone: Not on file    Gets together: Not on file    Attends religious service: Not on file    Active member of club or organization: Not on file    Attends meetings of clubs or organizations: Not on file    Relationship status: Not on file  Other Topics Concern  . Not on file  Social History Narrative  . Not on file    Hospital Course:  Jenilee ARRA CONNAUGHTON was admitted for MDD (major depressive disorder), severe (HCC)  and crisis management.  Pt was treated discharged with the medications listed below under Medication List.  Medical problems were identified and treated as needed.  Home medications were restarted as appropriate.  Improvement was monitored by observation and Angeni Edmund Hilda 's daily report of symptom reduction.  Emotional and mental status was monitored by daily self-inventory reports completed by Juanita Laster and  clinical staff.         Adrie E Godsil was evaluated by the treatment team for stability and plans for continued recovery upon discharge. Natalie E Arauz 's motivation was an integral factor for scheduling further treatment. Employment, transportation, bed availability, health status, family support, and any pending legal issues were also considered during hospital stay. Pt was offered further treatment options upon discharge including but not limited to Residential, Intensive Outpatient, and Outpatient treatment.  Kamariah E Shewmake will follow up with the services as listed below under Follow Up Information.     Upon  completion of this admission the patient was both mentally and medically stable for discharge denying suicidal/homicidal ideation, auditory/visual/tactile hallucinations, delusional thoughts and paranoia.    Family session went well with MD and Social worker via phone discussion with patients mother. See SRA   Beatris E Bialas responded well to treatment with Zoloft 50 mg and trazodone 50 mg without adverse effects.  Pt demonstrated improvement without reported or observed adverse effects to the point of stability appropriate for outpatient management. Pertinent labs include: lipid panel and A1c and CMP  for which outpatient follow-up is necessary for lab recheck as mentioned below. Reviewed CBC, CMP, BAL, and UDS+THC ; all unremarkable aside from noted exceptions.   Physical Findings: AIMS: Facial and Oral Movements Muscles of Facial Expression: None, normal Lips and Perioral Area: None, normal Jaw: None, normal Tongue: None, normal,Extremity Movements Upper (arms, wrists, hands, fingers): None, normal Lower (legs, knees, ankles, toes): None, normal, Trunk Movements Neck, shoulders, hips: None, normal, Overall Severity Severity of abnormal movements (highest score from questions above): None, normal Incapacitation due to abnormal movements: None, normal Patient's awareness of abnormal movements (rate only patient's report): No Awareness, Dental Status Current problems with teeth and/or dentures?: No Does patient usually wear dentures?: No  CIWA:  CIWA-Ar Total: 0 COWS:  COWS Total Score: 1  Musculoskeletal: Strength & Muscle Tone: within normal limits Gait & Station: normal Patient leans: N/A  Psychiatric Specialty Exam: See SRA by MD Physical Exam  Constitutional: She is oriented to person, place, and time. She appears well-developed.  Neurological: She is oriented to person, place, and time.  Psychiatric: She has a normal mood and affect. Her behavior is normal.    Review of  Systems  Psychiatric/Behavioral: Negative for depression (stable) and suicidal ideas. The patient is nervous/anxious (improved).   All other systems reviewed and are negative.   Blood pressure (!) 133/99, pulse 88, temperature 98.2 F (36.8 C), temperature source Oral, resp. rate 16, SpO2 99 %.There is no height or weight on file to calculate BMI.    Have you used any form of tobacco in the last 30 days? (Cigarettes, Smokeless Tobacco, Cigars, and/or Pipes): No  Has this patient used any form of tobacco in the last 30 days? (Cigarettes, Smokeless Tobacco, Cigars, and/or Pipes) No  Blood Alcohol level:  Lab Results  Component Value Date   ETH <10 06/11/2018    Metabolic Disorder Labs:  Lab Results  Component Value Date   HGBA1C 6.5 (H) 06/11/2018   MPG 139.85 06/11/2018   No results found for: PROLACTIN Lab Results  Component Value Date   CHOL 163 06/11/2018   TRIG 164 (H) 06/11/2018   HDL 50 06/11/2018   CHOLHDL 3.3 06/11/2018   VLDL 33 06/11/2018   LDLCALC 80 06/11/2018    See Psychiatric Specialty Exam and Suicide Risk Assessment completed by Attending Physician prior to discharge.  Discharge destination:  Home  Is patient on multiple antipsychotic therapies at discharge:  No   Has Patient had three or more failed trials of antipsychotic monotherapy by history:  No  Recommended Plan for Multiple Antipsychotic Therapies: NA  Discharge Instructions    Diet - low sodium heart healthy   Complete by:  As directed    Diet - low sodium heart healthy   Complete by:  As directed    Discharge instructions   Complete by:  As directed    Take all medications as prescribed. Keep all follow-up appointments as scheduled.  Do not consume alcohol or use illegal drugs while on prescription medications. Report any adverse effects from your medications to your primary care provider promptly.  In the event of recurrent symptoms or worsening symptoms, call 911, a crisis hotline, or  go to the nearest emergency department for evaluation.   Discharge instructions   Complete by:  As directed    Take all medications as prescribed. Keep all follow-up appointments as scheduled.  Do not consume alcohol or use illegal drugs while on prescription medications. Report any adverse effects from your medications to your primary care provider promptly.  In the event of recurrent symptoms or worsening symptoms, call 911, a crisis hotline, or go to the nearest emergency department for evaluation.   Increase activity slowly   Complete by:  As directed    Increase activity slowly   Complete by:  As directed      Allergies as of 06/13/2018   No Known Allergies     Medication List    STOP taking these medications   lisinopril 20 MG tablet Commonly known as:  PRINIVIL,ZESTRIL     TAKE these medications     Indication  ALIGN PO Take 1 tablet by mouth daily.  Indication:  gi   calcium-vitamin D 500-200 MG-UNIT tablet Commonly known as:  OSCAL WITH D Take 1 tablet by mouth 2 (two) times daily.  Indication:  Low Amount of Calcium in the Blood   LOMEDIA 24 FE 1-20 MG-MCG(24) tablet Generic drug:  Norethindrone Acetate-Ethinyl Estrad-FE Take 1 tablet by mouth daily.  Indication:  Excessive Amount of Menstrual Volume   sertraline 50 MG tablet Commonly known as:  ZOLOFT Take 50 mg by mouth daily.  Indication:  Major Depressive Disorder   traZODone 50 MG tablet Commonly known as:  DESYREL Take 1 tablet (50 mg total) by mouth at bedtime as needed for sleep. What changed:  how much to take  Indication:  Trouble Sleeping   vitamin B-12 1000 MCG tablet Commonly known as:  CYANOCOBALAMIN Take 1,000 mcg by mouth daily.  Indication:  Pernicious Anemia   Vitamin D (Ergocalciferol) 50000 units Caps capsule Commonly known as:  DRISDOL 50,000 Units every 7 (seven) days.  Indication:  Vitamin D Deficiency      Follow-up Information    BEHAVIORAL HEALTH PARTIAL  HOSPITALIZATION PROGRAM Follow up on 06/16/2018.   Specialty:  Behavioral Health Why:  Appointment is Tuesday, 06/16/18 at 1:30pm. Please be sure to bring your Photo ID and any discharge paperwork from this hospitalization.  Contact information: [address] [id number] mc [place] [address] [phone]          Follow-up recommendations:  Activity:  as tolerated Diet:  heart healthy  Comments:  Take all medications as prescribed. Keep all follow-up appointments as scheduled.  Do not consume alcohol or use illegal drugs while on prescription medications. Report any adverse effects from your medications to your  primary care provider promptly.  In the event of recurrent symptoms or worsening symptoms, call 911, a crisis hotline, or go to the nearest emergency department for evaluation.   Signed: Oneta Rackanika N Lewis, NP 06/13/2018, 9:49 AM   Patient seen, Suicide Assessment Completed.  Disposition Plan Reviewed

## 2018-06-13 NOTE — Progress Notes (Signed)
Writer has observed patient up in the dayroom interacting with peers. She reported to Clinical research associatewriter that she figured out that her family is her triggers. She reports that she explains to them how/what she is feeling and they don't listen. She has c/o of her ear hurting and has received medication to aid with the pain. Will continue to monitor effectiveness of medication. Safety maintained on unit with 15 min checks.

## 2018-06-13 NOTE — BHH Suicide Risk Assessment (Signed)
[facility] Discharge Suicide Risk Assessment   Principal Problem: MDD (major depressive disorder), severe [facility]) Discharge Diagnoses:  Patient Active Problem List   Diagnosis Date Noted  . MDD (major depressive disorder), severe (HCC) [F32.2] 06/09/2018    Total Time spent with patient: 30 minutes  Musculoskeletal: Strength & Muscle Tone: within normal limits Gait & Station: normal Patient leans: N/A  Psychiatric Specialty Exam: ROS-denies headache, no chest pain, no shortness of breath, no nausea, no vomiting  Blood pressure (!) 133/99, pulse 88, temperature 98.2 F (36.8 C), temperature source Oral, resp. rate 16, SpO2 99 %.There is no height or weight on file to calculate BMI.  General Appearance: Well Groomed  Eye Contact::  Good  Speech:  Normal Rate409  Volume:  Normal  Mood:  Reports her mood is "okay", denies feeling depressed at this time, affect is appropriate and full in range  Affect:  Full range  Thought Process:  Linear and Descriptions of Associations: Intact  Orientation:  Full (Time, Place, and Person)  Thought Content:  No hallucinations, no delusions, not internally preoccupied  Suicidal Thoughts:  No-denies suicidal ideations, denies self-injurious ideations  Homicidal Thoughts:  No-denies any violent or homicidal ideations  Memory:  Recent and remote grossly intact  Judgement:  Other:  Improving  Insight:  Fair  Psychomotor Activity:  Normal  Concentration:  Good  Recall:  Good  Fund of Knowledge:Good  Language: Good  Akathisia:  Negative  Handed:  Right  AIMS (if indicated):     Assets:  Communication Skills Desire for Improvement Resilience  Sleep:  Number of Hours: 6.25  Cognition: WNL  ADL's:  Intact   Mental Status Per Nursing Assessment::   On Admission:  Suicidal ideation indicated by patient, Plan includes specific time, place, or method, Intention to act on suicide plan, Suicidal ideation indicated by others, Self-harm thoughts, Belief that  plan would result in death, Suicide plan  Demographic Factors:  32 year old single female, lives alone, no children  Loss Factors: Recently quit her job, which she reports was very stressful, reports potential legal issues.  Historical Factors: History of depression, history of PTSD,  history of panic attacks  Risk Reduction Factors:   Sense of responsibility to family, Positive social support and Positive coping skills or problem solving skills  Continued Clinical Symptoms:  Currently patient is alert, attentive, well-groomed, presents calm without any psychomotor agitation or restlessness, mood is described as improved, minimizes depression at this time and presents euthymic, with a full range of affect, no thought disorder, not suicidal, not homicidal, no hallucinations, no delusions, future oriented. Denies medication side effects. At this time behavior on unit is in good control, visible in dayroom, interacting appropriately with peers, pleasant on approach. At patient's request and in her presence I spoke with patient's mother.  Mother lives in [place] but plans to come to [place] to spend some time with patient as of tomorrow.  Mother is hoping that patient will relocate to [place] with her and obtain further outpatient psychiatric services there.  Patient states she would rather remain in [place] as she feels she has more support here at this time.  Patient is future oriented and expressing interest in attending the PHP starting early next week.  Cognitive Features That Contribute To Risk:  No gross cognitive deficits noted upon discharge. Is alert , attentive, and oriented x 3   Suicide Risk:  Mild:  Suicidal ideation of limited frequency, intensity, duration, and specificity.  There are no identifiable plans, no  associated intent, mild dysphoria and related symptoms, good self-control (both objective and subjective assessment), few other risk factors, and identifiable protective  factors, including available and accessible social support.  Follow-up Information    BEHAVIORAL HEALTH PARTIAL HOSPITALIZATION PROGRAM Follow up on 06/16/2018.   Specialty:  Behavioral Health Why:  Appointment is Tuesday, 06/16/18 at 1:30pm. Please be sure to bring your Photo ID and any discharge paperwork from this hospitalization.  Contact information: [address] [id number] [place] [phone]          Plan Of Care/Follow-up recommendations:  Activity:  as tolerated  Diet:  heart healthy, low carbohydrate Tests:  NA Other:  See below  Patient is requesting discharge, there are no current grounds for involuntary commitment.  She is leaving unit in good spirits.  Plans to return home. Follow-up as above. We reviewed elevated Hgb A1c-patient reports that she has had elevated HgbA1C in the past as well-aware of importance of diet, regular aerobic activity, plans to follow-up with her PCP  for outpatient medical management as needed ( Dr. Adrian BlackwaterLithbaum)   Craige CottaFernando A Harith Mccadden, MD 06/13/2018, 10:00 AM

## 2018-06-13 NOTE — Progress Notes (Signed)
  [facility]  Will you be returning to the same living situation after discharge:  Yes,  home At discharge, do you have transportation home?: Yes,  friend Do you have the ability to pay for your medications: Yes,  states she has no current income  Release of information consent forms completed and turned in to Medical Records by CSW.   Patient to Follow up at: Follow-up Information    BEHAVIORAL HEALTH PARTIAL HOSPITALIZATION PROGRAM Follow up on 06/16/2018.   Specialty:  Behavioral Health Why:  Appointment is Tuesday, 06/16/18 at 1:30pm. Please be sure to bring your Photo ID and any discharge paperwork from this hospitalization.  Contact information: [address] [id number] [place] [phone]          Next level of care provider [facility]:yes  Safety Planning and Suicide Prevention discussed: No.  Two attempts made, brochure given to share with aunt  Have you used any form of tobacco in the last 30 days? (Cigarettes, Smokeless Tobacco, Cigars, and/or Pipes): No  Has patient been referred to the Quitline?: N/A patient is not a smoker  Patient has been referred for addiction treatment: N/A  Lynnell ChadMareida J Grossman-Orr, LCSW 06/13/2018, 11:01 AM

## 2018-06-13 NOTE — Plan of Care (Signed)
  Problem: Education: Goal: Ability to make informed decisions regarding treatment will improve Outcome: Completed/Met   Problem: Coping: Goal: Coping ability will improve Outcome: Completed/Met   Problem: Health Behavior/Discharge Planning: Goal: Identification of resources available to assist in meeting health care needs will improve Outcome: Completed/Met   Problem: Medication: Goal: Compliance with prescribed medication regimen will improve Outcome: Completed/Met   Problem: Self-Concept: Goal: Ability to disclose and discuss suicidal ideas will improve Outcome: Completed/Met Goal: Will verbalize positive feelings about self Outcome: Completed/Met   Problem: Activity: Goal: Will identify at least one activity in which they can participate Outcome: Completed/Met   Problem: Coping: Goal: Ability to identify and develop effective coping behavior will improve Outcome: Completed/Met Goal: Ability to interact with others will improve Outcome: Completed/Met Goal: Demonstration of participation in decision-making regarding own care will improve Outcome: Completed/Met Goal: Ability to use eye contact when communicating with others will improve Outcome: Completed/Met   Problem: Health Behavior/Discharge Planning: Goal: Identification of resources available to assist in meeting health care needs will improve Outcome: Completed/Met   Problem: Self-Concept: Goal: Will verbalize positive feelings about self Outcome: Completed/Met   Problem: Education: Goal: Knowledge of [facility] Education information/materials will improve Outcome: Completed/Met Goal: Emotional status will improve Outcome: Completed/Met Goal: Mental status will improve Outcome: Completed/Met Goal: Verbalization of understanding the information provided will improve Outcome: Completed/Met   Problem: Activity: Goal: Interest or engagement in activities will improve Outcome: Completed/Met Goal:  Sleeping patterns will improve Outcome: Completed/Met   Problem: Coping: Goal: Ability to verbalize frustrations and anger appropriately will improve Outcome: Completed/Met Goal: Ability to demonstrate self-control will improve Outcome: Completed/Met   Problem: Health Behavior/Discharge Planning: Goal: Identification of resources available to assist in meeting health care needs will improve Outcome: Completed/Met Goal: Compliance with treatment plan for underlying cause of condition will improve Outcome: Completed/Met   Problem: Physical Regulation: Goal: Ability to maintain clinical measurements within normal limits will improve Outcome: Completed/Met   Problem: Safety: Goal: Periods of time without injury will increase Outcome: Completed/Met   Problem: Education: Goal: Utilization of techniques to improve thought processes will improve Outcome: Completed/Met Goal: Knowledge of the prescribed therapeutic regimen will improve Outcome: Completed/Met   Problem: Activity: Goal: Interest or engagement in leisure activities will improve Outcome: Completed/Met Goal: Imbalance in normal sleep/wake cycle will improve Outcome: Completed/Met   Problem: Coping: Goal: Coping ability will improve Outcome: Completed/Met Goal: Will verbalize feelings Outcome: Completed/Met   Problem: Health Behavior/Discharge Planning: Goal: Ability to make decisions will improve Outcome: Completed/Met Goal: Compliance with therapeutic regimen will improve Outcome: Completed/Met   Problem: Role Relationship: Goal: Will demonstrate positive changes in social behaviors and relationships Outcome: Completed/Met   Problem: Safety: Goal: Ability to disclose and discuss suicidal ideas will improve Outcome: Completed/Met Goal: Ability to identify and utilize support systems that promote safety will improve Outcome: Completed/Met   Problem: Self-Concept: Goal: Will verbalize positive feelings about  self Outcome: Completed/Met Goal: Level of anxiety will decrease Outcome: Completed/Met   Problem: Spiritual Needs Goal: Ability to function at adequate level Outcome: Completed/Met

## 2018-06-16 ENCOUNTER — Ambulatory Visit (HOSPITAL_COMMUNITY): Payer: Self-pay

## 2018-06-17 ENCOUNTER — Other Ambulatory Visit (HOSPITAL_COMMUNITY): Payer: 59 | Attending: Psychiatry | Admitting: Professional

## 2018-06-17 DIAGNOSIS — F322 Major depressive disorder, single episode, severe without psychotic features: Secondary | ICD-10-CM

## 2018-06-18 NOTE — Psych (Signed)
1430  Pt presented for f/u from inpt. Pt's mother with her. Pt reports she is moving to [place] on Saturday, 06/20/18. Pt reports she came because she wanted to have f/u plan explained to her mother and "get an experts opinion on what would be best for me." Cln encourages pt to contact list of PHP programs in [place] provided by inpt, as well as contacting a psychiatrist and individual therapist. Cln encouraged pt to find a CBT/EMDR counselor. Cln encouraged pt to call programs this afternoon and not delay. Cln encouraged mother to contact local support groups such as [facility] and [facility] for self and pt. Pt and mother report understanding. Cln assessed for risk; pt denies any SI/HI/AVH at this time. Mother reports she will continue to stay with pt in [place] until time of move on Saturday; pt is moving in with mother in [place].  PHP here is declined at this time due to move.  Mackay Hanauer, LPCA, LCASA

## 2018-09-25 ENCOUNTER — Other Ambulatory Visit (HOSPITAL_COMMUNITY): Payer: Self-pay | Admitting: Family
